# Patient Record
Sex: Female | Born: 1995
Health system: Southern US, Community
[De-identification: ages and names within clinical notes are randomized; demographics above are authoritative.]

## PROBLEM LIST (undated history)

## (undated) DIAGNOSIS — F329 Major depressive disorder, single episode, unspecified: Secondary | ICD-10-CM

## (undated) DIAGNOSIS — B029 Zoster without complications: Secondary | ICD-10-CM

## (undated) DIAGNOSIS — R42 Dizziness and giddiness: Secondary | ICD-10-CM

## (undated) DIAGNOSIS — F32A Depression, unspecified: Secondary | ICD-10-CM

## (undated) DIAGNOSIS — F840 Autistic disorder: Secondary | ICD-10-CM

## (undated) DIAGNOSIS — R55 Syncope and collapse: Secondary | ICD-10-CM

## (undated) HISTORY — DX: Dizziness and giddiness: R42

## (undated) HISTORY — DX: Zoster without complications: B02.9

## (undated) HISTORY — DX: Autistic disorder: F84.0

## (undated) HISTORY — PX: NO PAST SURGERIES: SHX2092

## (undated) HISTORY — DX: Syncope and collapse: R55

---

## 2017-04-25 DIAGNOSIS — T1491XA Suicide attempt, initial encounter: Secondary | ICD-10-CM

## 2017-04-25 HISTORY — DX: Suicide attempt, initial encounter: T14.91XA

## 2018-01-19 ENCOUNTER — Emergency Department
Admission: EM | Admit: 2018-01-19 | Discharge: 2018-01-19 | Disposition: A | Discharge status: Other Acute Inpatient Hospital | Payer: PRIVATE HEALTH INSURANCE | Attending: Emergency Medicine | Admitting: Emergency Medicine

## 2018-01-19 ENCOUNTER — Other Ambulatory Visit: Payer: Self-pay

## 2018-01-19 ENCOUNTER — Inpatient Hospital Stay
Admission: AD | Admit: 2018-01-19 | Discharge: 2018-01-23 | DRG: 885 | Disposition: A | Discharge status: Home / Self Care | Payer: PRIVATE HEALTH INSURANCE | Attending: Psychiatry | Admitting: Psychiatry

## 2018-01-19 ENCOUNTER — Encounter: Payer: Self-pay | Admitting: Psychiatry

## 2018-01-19 DIAGNOSIS — T43692A Poisoning by other psychostimulants, intentional self-harm, initial encounter: Secondary | ICD-10-CM | POA: Insufficient documentation

## 2018-01-19 DIAGNOSIS — T43212A Poisoning by selective serotonin and norepinephrine reuptake inhibitors, intentional self-harm, initial encounter: Secondary | ICD-10-CM | POA: Diagnosis not present

## 2018-01-19 DIAGNOSIS — Z9151 Personal history of suicidal behavior: Secondary | ICD-10-CM

## 2018-01-19 DIAGNOSIS — R45851 Suicidal ideations: Secondary | ICD-10-CM | POA: Diagnosis present

## 2018-01-19 DIAGNOSIS — F909 Attention-deficit hyperactivity disorder, unspecified type: Secondary | ICD-10-CM | POA: Diagnosis present

## 2018-01-19 DIAGNOSIS — T1491XA Suicide attempt, initial encounter: Secondary | ICD-10-CM

## 2018-01-19 DIAGNOSIS — F329 Major depressive disorder, single episode, unspecified: Secondary | ICD-10-CM | POA: Diagnosis present

## 2018-01-19 DIAGNOSIS — F332 Major depressive disorder, recurrent severe without psychotic features: Principal | ICD-10-CM | POA: Diagnosis present

## 2018-01-19 DIAGNOSIS — Z008 Encounter for other general examination: Secondary | ICD-10-CM | POA: Diagnosis not present

## 2018-01-19 DIAGNOSIS — T50902A Poisoning by unspecified drugs, medicaments and biological substances, intentional self-harm, initial encounter: Secondary | ICD-10-CM

## 2018-01-19 HISTORY — DX: Depression, unspecified: F32.A

## 2018-01-19 HISTORY — DX: Personal history of suicidal behavior: Z91.51

## 2018-01-19 HISTORY — DX: Major depressive disorder, single episode, unspecified: F32.9

## 2018-01-19 LAB — COMPREHENSIVE METABOLIC PANEL
ALK PHOS: 54 U/L (ref 38–126)
ALT: 7 U/L (ref 0–44)
AST: 18 U/L (ref 15–41)
Albumin: 5 g/dL (ref 3.5–5.0)
Anion gap: 11 (ref 5–15)
BUN: 12 mg/dL (ref 6–20)
CALCIUM: 9.6 mg/dL (ref 8.9–10.3)
CO2: 21 mmol/L — ABNORMAL LOW (ref 22–32)
CREATININE: 0.82 mg/dL (ref 0.44–1.00)
Chloride: 105 mmol/L (ref 98–111)
Glucose, Bld: 98 mg/dL (ref 70–99)
Potassium: 3.6 mmol/L (ref 3.5–5.1)
Sodium: 137 mmol/L (ref 135–145)
Total Bilirubin: 1.1 mg/dL (ref 0.3–1.2)
Total Protein: 8 g/dL (ref 6.5–8.1)

## 2018-01-19 LAB — CBC WITH DIFFERENTIAL/PLATELET
BASOS ABS: 0 10*3/uL (ref 0–0.1)
Basophils Relative: 0 %
EOS ABS: 0 10*3/uL (ref 0–0.7)
EOS PCT: 0 %
HCT: 39.7 % (ref 35.0–47.0)
Hemoglobin: 14 g/dL (ref 12.0–16.0)
Lymphocytes Relative: 11 %
Lymphs Abs: 1.1 10*3/uL (ref 1.0–3.6)
MCH: 28.9 pg (ref 26.0–34.0)
MCHC: 35.3 g/dL (ref 32.0–36.0)
MCV: 81.7 fL (ref 80.0–100.0)
Monocytes Absolute: 0.7 10*3/uL (ref 0.2–0.9)
Monocytes Relative: 7 %
Neutro Abs: 8.2 10*3/uL — ABNORMAL HIGH (ref 1.4–6.5)
Neutrophils Relative %: 82 %
PLATELETS: 264 10*3/uL (ref 150–440)
RBC: 4.86 MIL/uL (ref 3.80–5.20)
RDW: 13.4 % (ref 11.5–14.5)
WBC: 10.1 10*3/uL (ref 3.6–11.0)

## 2018-01-19 LAB — ACETAMINOPHEN LEVEL: Acetaminophen (Tylenol), Serum: <10 ug/mL — ABNORMAL LOW (ref 10–30)

## 2018-01-19 LAB — SALICYLATE LEVEL: Salicylate Lvl: <7 mg/dL (ref 2.8–30.0)

## 2018-01-19 LAB — HCG, QUANTITATIVE, PREGNANCY: hCG, Beta Chain, Quant, S: <1 m[IU]/mL (ref <5)

## 2018-01-19 LAB — ETHANOL

## 2018-01-19 MED ORDER — PAROXETINE HCL 20 MG PO TABS: 20.0000 mg | ORAL_TABLET | Freq: Every day | ORAL | Status: DC

## 2018-01-19 MED ORDER — MAGNESIUM HYDROXIDE 400 MG/5ML PO SUSP: 30.0000 mL | Freq: Every day | ORAL | Status: DC | PRN

## 2018-01-19 MED ORDER — PAROXETINE HCL 20 MG PO TABS
20.0000 mg | ORAL_TABLET | Freq: Every day | ORAL | Status: DC
  Administered 2018-01-20: 20 mg via ORAL
  Filled 2018-01-19: qty 1

## 2018-01-19 MED ORDER — ACETAMINOPHEN 325 MG PO TABS: 650.0000 mg | ORAL_TABLET | Freq: Four times a day (QID) | ORAL | Status: DC | PRN

## 2018-01-19 MED ORDER — ALUM & MAG HYDROXIDE-SIMETH 200-200-20 MG/5ML PO SUSP: 30.0000 mL | ORAL | Status: DC | PRN

## 2018-01-19 MED ORDER — HYDROXYZINE HCL 50 MG PO TABS
50.0000 mg | ORAL_TABLET | Freq: Three times a day (TID) | ORAL | Status: DC | PRN
  Administered 2018-01-19: 50 mg via ORAL
  Filled 2018-01-19 (×2): qty 1

## 2018-01-19 MED ORDER — SODIUM CHLORIDE 0.9 % IV SOLN
1000.0000 mL | Freq: Once | INTRAVENOUS | Status: AC
  Administered 2018-01-19: 1000 mL via INTRAVENOUS

## 2018-01-19 MED ORDER — TRAZODONE HCL 100 MG PO TABS
100.0000 mg | ORAL_TABLET | Freq: Every evening | ORAL | Status: DC | PRN
  Administered 2018-01-19: 100 mg via ORAL
  Filled 2018-01-19 (×2): qty 1

## 2018-01-19 MED ORDER — LORAZEPAM 2 MG PO TABS
2.0000 mg | ORAL_TABLET | Freq: Once | ORAL | Status: AC
  Administered 2018-01-19: 2 mg via ORAL
  Filled 2018-01-19: qty 1

## 2018-01-19 NOTE — BH Assessment (Signed)
Patient is to be admitted to [facility] by Dr. Toni Amend.  Attending Physician will be Dr. Jennet Maduro.   Patient has been assigned to room 324, by [facility] Charge Nurse Shatara.   ER staff is aware of the admission:  Luann,ER Secretary    Dr. Cyril Loosen, ER MD   Geralynn Ochs, Patient's Nurse   Lacy Duverney. [facility] Patient Access.

## 2018-01-19 NOTE — ED Notes (Signed)
Patient received an Ativan 2mg  due to anxiety, patient says she can not relax she feels very shaky. Informed patient that the medications she may have taken an excess of maybe the reason she is feeling that way

## 2018-01-19 NOTE — ED Provider Notes (Signed)
[facility] Emergency Department Provider Note   ____________________________________________    I have reviewed the triage vital signs and the nursing notes.   HISTORY  Chief Complaint Suicide Attempt     HPI Kathleen Robinson is a 22 y.o. female with a history of major depression who presents today after a suicide attempt.  Patient reports at 11:00 last night she took 20 Adderall tablets, and 25 Paxil.  She states that "I was not supposed to wake up ".  She reports a history of depression in the past but has been overall well controlled.  Currently states she feels fatigued but denies abdominal pain or chest pain.  No shortness of breath.   Past Medical History:  Diagnosis Date  . Depression     Patient Active Problem List   Diagnosis Date Noted  . Severe recurrent major depression without psychotic features (HCC) 01/19/2018  . Suicide attempt (HCC) 01/19/2018  . Amphetamine overdose (HCC) 01/19/2018    History reviewed. No pertinent surgical history.  Prior to Admission medications   Medication Sig Start Date End Date Taking? Authorizing Provider  ADDERALL XR 20 MG 24 hr capsule Take 20 mg by mouth daily. 01/08/18  Yes [provider]  PARoxetine (PAXIL) 20 MG tablet Take 20 mg by mouth every morning. 01/11/18  Yes [provider]     Allergies Wasp venom  Family History  Problem Relation Age of Onset  . Diabetes Other   . Stroke Other     Social History Social History   Tobacco Use  . Smoking status: Never Smoker  . Smokeless tobacco: Never Used  Substance Use Topics  . Alcohol use: Yes    Comment: occassionally   . Drug use: Not on file    Review of Systems  Constitutional: Fatigue Eyes: No visual changes.  ENT: No sore throat. Cardiovascular: Denies chest pain. Respiratory: Denies shortness of breath. Gastrointestinal: No abdominal pain.  No nausea, no vomiting.   Genitourinary: Negative for  dysuria. Musculoskeletal: Negative for back pain. Skin: Negative for rash. Neurological: Negative for headache   ____________________________________________   PHYSICAL EXAM:  VITAL SIGNS: ED Triage Vitals  Enc Vitals Group     BP 01/19/18 0843 (!) 141/120     Pulse Rate 01/19/18 0843 (!) 110     Resp 01/19/18 0843 18     Temp 01/19/18 0843 98.1 F (36.7 C)     Temp Source 01/19/18 0843 Oral     SpO2 01/19/18 0843 99 %     Weight 01/19/18 0844 63.5 kg (140 lb)     Height 01/19/18 0844 1.651 m (5\' 5" )     Head Circumference --      Peak Flow --      Pain Score 01/19/18 0844 0     Pain Loc --      Pain Edu? --      Excl. in GC? --     Constitutional: Alert and oriented.   Nose: No congestion/rhinnorhea. Mouth/Throat: Mucous membranes are moist.    Cardiovascular: Normal rate, regular rhythm. Grossly normal heart sounds.  Good peripheral circulation. Respiratory: Normal respiratory effort.  No retractions. Lungs CTAB. Gastrointestinal: Soft and nontender. No distention.  No CVA tenderness.  Musculoskeletal:  Warm and well perfused Neurologic:  Normal speech and language. No gross focal neurologic deficits are appreciated.  Skin:  Skin is warm, dry and intact. No rash noted. Psychiatric: Very high functioning and clearly intelligent, depressed mood speech and behavior  are normal.  ____________________________________________   LABS (all labs ordered are listed, but only abnormal results are displayed)  Labs Reviewed  COMPREHENSIVE METABOLIC PANEL - Abnormal; Notable for the following components:      Result Value   CO2 21 (*)    All other components within normal limits  CBC WITH DIFFERENTIAL/PLATELET - Abnormal; Notable for the following components:   Neutro Abs 8.2 (*)    All other components within normal limits  ACETAMINOPHEN LEVEL - Abnormal; Notable for the following components:   Acetaminophen (Tylenol), Serum <10 (*)    All other components within  normal limits  ETHANOL  SALICYLATE LEVEL  URINE DRUG SCREEN, QUALITATIVE ([facility] ONLY)  POC URINE PREG, ED   ____________________________________________  EKG  ED ECG REPORT I, Jene Every, the attending physician, personally viewed and interpreted this ECG.  Date: 01/19/2018  Rhythm: Sinus tachycardia QRS Axis: normal Intervals: normal ST/T Wave abnormalities: normal Narrative Interpretation: no evidence of acute ischemia  ____________________________________________  RADIOLOGY  None ____________________________________________   PROCEDURES  Procedure(s) performed: No  Procedures   Critical Care performed: No ____________________________________________   INITIAL IMPRESSION / ASSESSMENT AND PLAN / ED COURSE  Pertinent labs & imaging results that were available during my care of the patient were reviewed by me and considered in my medical decision making (see chart for details).  Patient presents after suicide attempt by intentional drug overdose.  She took her pills at 11 PM last night, vital signs are reassuring.  Lab work is overall unremarkable, Poison control contacted by nurse, recommend supportive care only.  Patient placed under involuntary commitment, TTS and psychiatry consulted.  Psychiatry will admit    ____________________________________________   FINAL CLINICAL IMPRESSION(S) / ED DIAGNOSES  Final diagnoses:  Intentional drug overdose, initial encounter [facility])        Note:  This document was prepared using Dragon voice recognition software and may include unintentional dictation errors.    Jene Every, MD 01/19/18 1451

## 2018-01-19 NOTE — ED Notes (Signed)
Patient talking with father Allia Wiltsey [phone]

## 2018-01-19 NOTE — Progress Notes (Signed)
Patient has been out in the milieu, pleasant and cooperative. Has been talking with peers and maintains appropriate behavior. Alert and oriented. Denying thoughts of self harm but continues to express hopelessness. Staff continue to provide emotional support and encouragements. Safety precautions reinforced.

## 2018-01-19 NOTE — Progress Notes (Signed)
Patient admitted onto BMU unit due to SI attempt last night by overdose on medications. During assessment patient stated her depression and recent loss of relationship with boyfriend of 5 years triggered her to not want to live any longer. Patient states In her mind she just hears how she is a failure at life and no one wants a sensitive woman. 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 Patient is very soft spoken, tearful, logical and coherent. Patient denies at this moment SI, HI and AVH. Patient states she is thankful her friend telephoned for help and she is still alive. Pain 0/10; No skin issues. Patient received alcohol abuse education due to stating she goes on binge drinking of wine monthly but would like to stop. Patient denies cigarette use. While in ED blood pressures where elevated. No self injurious behaviors at this time.

## 2018-01-19 NOTE — ED Notes (Signed)
Spoke with Poison Control nurse Rose, RN [phone] at 1025 and her recommendations are IV fluids for the tachycardia and hypertension, Benzodiazepines if any seizure activity, Supportive care and lastly she recommends observation time for 8 hours to be cleared at 1630

## 2018-01-19 NOTE — ED Notes (Signed)
Patient visiting with father  

## 2018-01-19 NOTE — ED Triage Notes (Signed)
Patient to ED via [facility] c/o suicide attempt. Per EMS patient reported taking approximately "20 adderrall and 25 paxil" tablets last night around 11pm, and reports throwing up twice. Patient is alert and oriented at this time denies pain, but continues to endorse suicidal ideation.

## 2018-01-19 NOTE — Plan of Care (Signed)
Newly admitted but adjusting well to the unit. Sad and anxious but maintains a pleasant attitude. Denying suicidal ideations.

## 2018-01-19 NOTE — BH Assessment (Signed)
Assessment Note  Kathleen Robinson is an 22 y.o. female who presents to the ER due to a suicide attempt. On last night (01/18/2018), the patient took an overdose of her medications 20 Adderall and approximately 15 Paxil. Patient reports, "last night I was supposed to (have) died. I suppose to be dead. I don't suppose to be here (living)..." Patient made this statement several times throughout the interview. She reports of having dealt with thoughts of suicide for approximately a year but had no attempts or gestures until last night. Last night, her girlfriend ended their relationship and patient believes "it was the straw that broke the camel back."  During the interview, the patient was calm, cooperative and pleasant. She was able to provide appropriate answers to the questions. She states she has dealt with depression for majority of her life and her PCP currently prescribes her medications. She seen a psychiatrist when she was in high school but has never been inpatient for treatment.  Patient identified the breakup with her girlfriend as the main cause for her mental and emotional state. She identify other stressors that contributed to her suicide attempt; such as, been unemployed, relationship problems with family and ongoing depression.  Patient currently denies HI and AV/H.  Diagnosis: Depression  Past Medical History:  Past Medical History:  Diagnosis Date  . Depression     History reviewed. No pertinent surgical history.  Family History:  Family History  Problem Relation Age of Onset  . Diabetes Other   . Stroke Other     Social History:  reports that she has never smoked. She has never used smokeless tobacco. She reports that she drinks alcohol. Her drug history is not on file.  Additional Social History:  Alcohol / Drug Use Pain Medications: See PTA Prescriptions: See PTA Over the Counter: See PTA History of alcohol / drug use?: No history of alcohol / drug abuse Longest  period of sobriety (when/how long): Reports of no past or current use Negative Consequences of Use: (n/a) Withdrawal Symptoms: (n/a)  CIWA: CIWA-Ar BP: (!) 152/112 Pulse Rate: (!) 104 COWS:    Allergies:  Allergies  Allergen Reactions  . Wasp Venom Swelling    Home Medications:  (Not in a hospital admission)  OB/GYN Status:  Patient's last menstrual period was 12/29/2017 (lmp unknown).  General Assessment Data Location of Assessment: [facility] ED TTS Assessment: In system Is this a Tele or Face-to-Face Assessment?: Face-to-Face Is this an Initial Assessment or a Re-assessment for this encounter?: Initial Assessment Patient Accompanied by:: N/A Language Other than English: No Living Arrangements: Other (Comment)(Private Home) What gender do you identify as?: Female Marital status: Single Maiden name: Fidel Pregnancy Status: No Living Arrangements: Alone Can pt return to current living arrangement?: Yes Admission Status: Involuntary Petitioner: ED Attending Is patient capable of signing voluntary admission?: No(Under IVC) Referral Source: Self/Family/Friend Insurance type: Astronomer Exam [facility] Walk-in ONLY) Medical Exam completed: Yes  Crisis Care Plan Living Arrangements: Alone Legal Guardian: Other:(Self) Name of Psychiatrist: Reports of none Name of Therapist: Reports of none  Education Status Is patient currently in school?: No Is the patient employed, unemployed or receiving disability?: Unemployed  Risk to self with the past 6 months Suicidal Ideation: Yes-Currently Present Has patient been a risk to self within the past 6 months prior to admission? : Yes Suicidal Intent: Yes-Currently Present Has patient had any suicidal intent within the past 6 months prior to admission? : Yes Is patient at risk for suicide?:  Yes Suicidal Plan?: Yes-Currently Present Has patient had any suicidal plan within the past 6 months prior to admission? :  Yes Specify Current Suicidal Plan: Overdose on medications Access to Means: Yes What has been your use of drugs/alcohol within the last 12 months?: Personal medications Previous Attempts/Gestures: No How many times?: 1(With this visit) Other Self Harm Risks: Reports of none Triggers for Past Attempts: None known Intentional Self Injurious Behavior: None Family Suicide History: Yes(Grandmother) Recent stressful life event(s): Financial Problems, Other (Comment), Loss (Comment) Persecutory voices/beliefs?: No Depression: Yes Depression Symptoms: Tearfulness, Isolating, Guilt, Feeling worthless/self pity, Loss of interest in usual pleasures Substance abuse history and/or treatment for substance abuse?: No Suicide prevention information given to non-admitted patients: Not applicable  Risk to Others within the past 6 months Homicidal Ideation: No Does patient have any lifetime risk of violence toward others beyond the six months prior to admission? : No Thoughts of Harm to Others: No Current Homicidal Intent: No Current Homicidal Plan: No Access to Homicidal Means: No Identified Victim: Reports of none History of harm to others?: No Assessment of Violence: None Noted Violent Behavior Description: Reports of none Does patient have access to weapons?: No Criminal Charges Pending?: No Does patient have a court date: No Is patient on probation?: No  Psychosis Hallucinations: None noted Delusions: None noted  Mental Status Report Appearance/Hygiene: Unremarkable, In scrubs Eye Contact: Good Motor Activity: Freedom of movement, Unremarkable Speech: Logical/coherent, Unremarkable Level of Consciousness: Alert Mood: Depressed, Anxious, Sad, Pleasant Affect: Appropriate to circumstance, Depressed Anxiety Level: Minimal Thought Processes: Coherent, Relevant Judgement: Unimpaired Orientation: Person, Place, Time, Situation, Appropriate for developmental age Obsessive Compulsive  Thoughts/Behaviors: Minimal  Cognitive Functioning Concentration: Normal Memory: Recent Intact, Remote Intact Is patient IDD: No Insight: Fair Impulse Control: Poor Appetite: Good Have you had any weight changes? : No Change Sleep: No Change Total Hours of Sleep: 8 Vegetative Symptoms: None  ADLScreening [facility] Assessment Services) Patient's cognitive ability adequate to safely complete daily activities?: Yes Patient able to express need for assistance with ADLs?: Yes Independently performs ADLs?: Yes (appropriate for developmental age)  Prior Inpatient Therapy Prior Inpatient Therapy: No  Prior Outpatient Therapy Prior Outpatient Therapy: Yes Prior Therapy Dates: "When I was in the High School" Prior Therapy Facilty/Provider(s): Don't remember Reason for Treatment: Depression & ADHD Does patient have an ACCT team?: No Does patient have Intensive In-House Services?  : No Does patient have [facility] services? : No Does patient have P4CC services?: No  ADL Screening (condition at time of admission) Patient's cognitive ability adequate to safely complete daily activities?: Yes Is the patient deaf or have difficulty hearing?: No Does the patient have difficulty seeing, even when wearing glasses/contacts?: No Does the patient have difficulty concentrating, remembering, or making decisions?: No Patient able to express need for assistance with ADLs?: Yes Does the patient have difficulty dressing or bathing?: No Independently performs ADLs?: Yes (appropriate for developmental age) Does the patient have difficulty walking or climbing stairs?: No Weakness of Legs: None Weakness of Arms/Hands: None  Home Assistive Devices/Equipment Home Assistive Devices/Equipment: None  Therapy Consults (therapy consults require a physician order) PT Evaluation Needed: No OT Evalulation Needed: No SLP Evaluation Needed: No Abuse/Neglect Assessment (Assessment to be complete while patient is  alone) Abuse/Neglect Assessment Can Be Completed: Yes Physical Abuse: Denies Verbal Abuse: Denies Sexual Abuse: Denies Exploitation of patient/patient's resources: Denies Self-Neglect: Denies Values / Beliefs Cultural Requests During Hospitalization: None Spiritual Requests During Hospitalization: None Consults Spiritual Care Consult Needed: No  Social Work Consult Needed: No Merchant navy officer (For Healthcare) Does Patient Have a Programmer, multimedia?: No Would patient like information on creating a medical advance directive?: No - Patient declined       Child/Adolescent Assessment Running Away Risk: Denies(Patient is an adult)  Disposition:  Disposition Initial Assessment Completed for this Encounter: Yes  On Site Evaluation by:   Reviewed with Physician:    Lilyan Gilford MS, LCAS, LPC, NCC, CCSI Therapeutic Triage Specialist 01/19/2018 11:43 AM

## 2018-01-19 NOTE — Tx Team (Signed)
Initial Treatment Plan 01/19/2018 6:44 PM Kathleen Robinson ZOX:[id number]    PATIENT STRESSORS: Financial difficulties Loss of break of with signifigant other Occupational concerns   PATIENT STRENGTHS: Ability for insight Capable of independent living Communication skills Motivation for treatment/growth   PATIENT IDENTIFIED PROBLEMS: Coping skills  Low self esteem  Possible Occupational loss                  DISCHARGE CRITERIA:  Ability to meet basic life and health needs Improved stabilization in mood, thinking, and/or behavior Motivation to continue treatment in a less acute level of care  PRELIMINARY DISCHARGE PLAN: Return to previous living arrangement Return to previous work or school arrangements  PATIENT/FAMILY INVOLVEMENT: This treatment plan has been presented to and reviewed with the patient, Kathleen Robinson, and/or family member.  The patient and family have been given the opportunity to ask questions and make suggestions.  Leamon Arnt, RN 01/19/2018, 6:44 PM

## 2018-01-19 NOTE — Consult Note (Signed)
[place] Consult   Reason for Consult: Consult for this 22 year old woman who comes into the hospital after taking a overdose with suicidal intent Referring Physician: Corky Downs Patient Identification: Kathleen Robinson MRN:  [id number] Principal Diagnosis: Severe recurrent major depression without psychotic features [facility]) Diagnosis:   Patient Active Problem List   Diagnosis Date Noted  . Severe recurrent major depression without psychotic features ([place]) [F33.2] 01/19/2018  . Suicide attempt ([place]) [T14.91XA] 01/19/2018  . Amphetamine overdose [facility]) [T43.621A] 01/19/2018    Total Time spent with patient: 1 hour  Subjective:   [name] is a 22 y.o. female patient admitted with "I have been having a really rough time".  HPI: Patient interviewed chart reviewed.  Case reviewed with emergency room physician.  22 year old woman came into the hospital by EMS.  Last night she took an overdose of Adderall and Paxil.  She estimates she took approximately 20 or more of her Adderall which she says were 20 mg strength.  She did this after weeks of depressed mood that have been getting worse.  On top of that having a breakup with her girlfriend.  Recently been sleeping poorly not eating very well.  She took the overdose of pills and did not tell anyone immediately.  Only after a while did she make some suggestive comments online to a friend and it was that person who got her attention to call 911.  Patient continues to state suicidal ideation.  Denies any psychotic symptoms.  Denies that she was drinking or abusing drugs.  She is not currently seeing a mental health provider but takes Adderall and Paxil prescribed by her primary care doctor.  Social history: Patient is living by herself.  Works [employer] on [employer].  Recently broke up with a girlfriend.  Medical history: Remains a little bit hypertensive and tachycardic but medically stable otherwise and does not have any ongoing  medical issues.  Substance abuse history: Says that she drinks only occasionally denies any drinking problems denies that she uses any other drugs.  Past Psychiatric History: Patient reports having problems with depression and mood instability going back into adolescence at least.  Has had serious suicidal thoughts in the past and has been thinking about it more consistently for months now.  No previous psychiatric hospitalization or admission.  No previous suicide attempts.  No history of psychosis or mania.  Risk to Self: Suicidal Ideation: Yes-Currently Present Suicidal Intent: Yes-Currently Present Is patient at risk for suicide?: Yes Suicidal Plan?: Yes-Currently Present Specify Current Suicidal Plan: Overdose on medications Access to Means: Yes What has been your use of drugs/alcohol within the last 12 months?: Personal medications How many times?: 1(With this visit) Other Self Harm Risks: Reports of none Triggers for Past Attempts: None known Intentional Self Injurious Behavior: None Risk to Others: Homicidal Ideation: No Thoughts of Harm to Others: No Current Homicidal Intent: No Current Homicidal Plan: No Access to Homicidal Means: No Identified Victim: Reports of none History of harm to others?: No Assessment of Violence: None Noted Violent Behavior Description: Reports of none Does patient have access to weapons?: No Criminal Charges Pending?: No Does patient have a court date: No Prior Inpatient Therapy: Prior Inpatient Therapy: No Prior Outpatient Therapy: Prior Outpatient Therapy: Yes Prior Therapy Dates: "When I was in the [employer]" Prior Therapy Facilty/Provider(s): Don't remember Reason for Treatment: Depression & ADHD Does patient have an ACCT team?: No Does patient have Intensive In-House Services?  : No Does patient have [facility] services? :  No Does patient have P4CC services?: No  Past Medical History:  Past Medical History:  Diagnosis Date  .  Depression    History reviewed. No pertinent surgical history. Family History:  Family History  Problem Relation Age of Onset  . Diabetes Other   . Stroke Other    Family Psychiatric  History: She says she had a grandfather who attempted suicide more than once Social History:  Social History   Substance and Sexual Activity  Alcohol Use Yes   Comment: occassionally      Social History   Substance and Sexual Activity  Drug Use Not on file    Social History   Socioeconomic History  . Marital status: Single    Spouse name: Not on file  . Number of children: Not on file  . Years of education: Not on file  . Highest education level: Not on file  Occupational History  . Not on file  Social Needs  . Financial resource strain: Not on file  . Food insecurity:    Worry: Not on file    Inability: Not on file  . Transportation needs:    Medical: Not on file    Non-medical: Not on file  Tobacco Use  . Smoking status: Never Smoker  . Smokeless tobacco: Never Used  Substance and Sexual Activity  . Alcohol use: Yes    Comment: occassionally   . Drug use: Not on file  . Sexual activity: Not on file  Lifestyle  . Physical activity:    Days per week: Not on file    Minutes per session: Not on file  . Stress: Not on file  Relationships  . Social connections:    Talks on phone: Not on file    Gets together: Not on file    Attends religious service: Not on file    Active member of club or organization: Not on file    Attends meetings of clubs or organizations: Not on file    Relationship status: Not on file  Other Topics Concern  . Not on file  Social History Narrative  . Not on file   Additional Social History:    Allergies:   Allergies  Allergen Reactions  . Wasp Venom Swelling    Labs:  Results for orders placed or performed during the hospital encounter of 01/19/18 (from the past 48 hour(s))  Comprehensive metabolic panel     Status: Abnormal   Collection  Time: 01/19/18  9:11 AM  Result Value Ref Range   Sodium 137 135 - 145 mmol/L   Potassium 3.6 3.5 - 5.1 mmol/L   Chloride 105 98 - 111 mmol/L   CO2 21 (L) 22 - 32 mmol/L   Glucose, Bld 98 70 - 99 mg/dL   BUN 12 6 - 20 mg/dL   Creatinine, Ser 0.82 0.44 - 1.00 mg/dL   Calcium 9.6 8.9 - 10.3 mg/dL   Total Protein 8.0 6.5 - 8.1 g/dL   Albumin 5.0 3.5 - 5.0 g/dL   AST 18 15 - 41 U/L   ALT 7 0 - 44 U/L   Alkaline Phosphatase 54 38 - 126 U/L   Total Bilirubin 1.1 0.3 - 1.2 mg/dL   GFR calc non Af Amer >60 >60 mL/min   GFR calc Af Amer >60 >60 mL/min    Comment: (NOTE) The eGFR has been calculated using the CKD EPI equation. This calculation has not been validated in all clinical situations. eGFR's persistently <60 mL/min signify possible  Chronic Kidney Disease.    Anion gap 11 5 - 15    Comment: Performed at [facility], [address] [place], [place] [address]  Ethanol     Status: None   Collection Time: 01/19/18  9:11 AM  Result Value Ref Range   Alcohol, Ethyl (B) <10 <10 mg/dL    Comment: (NOTE) Lowest detectable limit for serum alcohol is 10 mg/dL. For medical purposes only. Performed at [facility], [address] [place], [place] [address]   CBC with Diff     Status: Abnormal   Collection Time: 01/19/18  9:11 AM  Result Value Ref Range   WBC 10.1 3.6 - 11.0 K/uL   RBC 4.86 3.80 - 5.20 MIL/uL   Hemoglobin 14.0 12.0 - 16.0 g/dL   HCT 39.7 35.0 - 47.0 %   MCV 81.7 80.0 - 100.0 fL   MCH 28.9 26.0 - 34.0 pg   MCHC 35.3 32.0 - 36.0 g/dL   RDW 13.4 11.5 - 14.5 %   Platelets 264 150 - 440 K/uL   Neutrophils Relative % 82 %   Neutro Abs 8.2 (H) 1.4 - 6.5 K/uL   Lymphocytes Relative 11 %   Lymphs Abs 1.1 1.0 - 3.6 K/uL   Monocytes Relative 7 %   Monocytes Absolute 0.7 0.2 - 0.9 K/uL   Eosinophils Relative 0 %   Eosinophils Absolute 0.0 0 - 0.7 K/uL   Basophils Relative 0 %   Basophils Absolute 0.0 0 - 0.1 K/uL    Comment: Performed at [facility], [address] [place], [place] [address]  Acetaminophen level     Status: Abnormal   Collection Time: 01/19/18  9:11 AM  Result Value Ref Range   Acetaminophen (Tylenol), Serum <10 (L) 10 - 30 ug/mL    Comment: (NOTE) Therapeutic concentrations vary significantly. A range of 10-30 ug/mL  may be an effective concentration for many patients. However, some  are best treated at concentrations outside of this range. Acetaminophen concentrations >150 ug/mL at 4 hours after ingestion  and >50 ug/mL at 12 hours after ingestion are often associated with  toxic reactions. Performed at [facility], [address] [place], [place] [address]   Salicylate level     Status: None   Collection Time: 01/19/18  9:11 AM  Result Value Ref Range   Salicylate Lvl <6.9 2.8 - 30.0 mg/dL    Comment: Performed at [facility], [address] [place], [place] [address]    No current facility-administered medications for this encounter.    Current Outpatient Medications  Medication Sig Dispense Refill  . ADDERALL XR 20 MG 24 hr capsule Take 20 mg by mouth daily.  0  . PARoxetine (PAXIL) 20 MG tablet Take 20 mg by mouth every morning.  3    Musculoskeletal: Strength & Muscle Tone: within normal limits Gait & Station: normal Patient leans: N/A  Psychiatric Specialty Exam: Physical Exam  Nursing note and vitals reviewed. Constitutional: She appears well-developed and well-nourished.  HENT:  Head: Normocephalic and atraumatic.  Eyes: Pupils are equal, round, and reactive to light. Conjunctivae are normal.  Neck: Normal range of motion.  Cardiovascular: Normal heart sounds.  Still a little bit tachycardic and hypertensive but stable and normal sinus rhythm  Respiratory: Effort normal.  GI: Soft.  Musculoskeletal: Normal range of motion.  Neurological: She is alert.  Skin: Skin is warm and dry.  Psychiatric: Her speech is delayed and tangential. She is  slowed  and withdrawn. Thought content is not paranoid. Cognition and memory are impaired. She expresses impulsivity. She exhibits a depressed mood. She expresses suicidal ideation. She expresses no homicidal ideation. She expresses suicidal plans.    Review of Systems  Constitutional: Negative.   HENT: Negative.   Eyes: Negative.   Respiratory: Negative.   Cardiovascular: Negative.   Gastrointestinal: Negative.   Musculoskeletal: Negative.   Skin: Negative.   Neurological: Negative.   Psychiatric/Behavioral: Positive for depression and suicidal ideas. Negative for hallucinations, memory loss and substance abuse. The patient is nervous/anxious and has insomnia.     Blood pressure (!) 152/112, pulse (!) 104, temperature 98.1 F (36.7 C), temperature source Oral, resp. rate 18, height '5\' 5"'  (1.651 m), weight 63.5 kg, last menstrual period 12/29/2017, SpO2 99 %.Body mass index is 23.3 kg/m.  General Appearance: Fairly Groomed  Eye Contact:  Fair  Speech:  Slow  Volume:  Normal  Mood:  Depressed  Affect:  Depressed  Thought Process:  Disorganized  Orientation:  Full (Time, Place, and Person)  Thought Content:  Rumination and Tangential  Suicidal Thoughts:  Yes.  with intent/plan  Homicidal Thoughts:  No  Memory:  Immediate;   Fair Recent;   Fair Remote;   Fair  Judgement:  Impaired  Insight:  Shallow  Psychomotor Activity:  Normal  Concentration:  Concentration: Fair  Recall:  [employer] of Knowledge:  Fair  Language:  Fair  Akathisia:  No  Handed:  Right  AIMS (if indicated):     Assets:  Desire for Improvement Housing Physical Health Resilience  ADL's:  Intact  Cognition:  WNL  Sleep:        Treatment Plan Summary: Daily contact with patient to assess and evaluate symptoms and progress in treatment, Medication management and Plan 22 year old woman who made a serious suicide attempt.  Patient presents currently as being calm.  There is something in her demeanor that at times  seems overly detailed which may possibly be from the excessive amount of Adderall but she is not showing any signs of psychosis or mania.  Physically stable.  Case reviewed with emergency room doctor who feels comfortable that the patient does not need further cardiac monitoring and may be admitted to the psychiatric hospital.  Patient understands the plan.  Case reviewed with TTS.  Patient will be admitted to the psychiatric ward.  Hold off on restarting any Adderall at this point.  Try to restart the Paxil just to prevent any withdrawal problems as she comes off the high dosage that she took.  PRN medicine for anxiety.  15-minute checks in place continue IV C.  Disposition: Recommend psychiatric Inpatient admission when medically cleared. Supportive therapy provided about ongoing stressors.  Alethia Berthold, MD 01/19/2018 12:09 PM

## 2018-01-20 DIAGNOSIS — F332 Major depressive disorder, recurrent severe without psychotic features: Principal | ICD-10-CM

## 2018-01-20 LAB — LIPID PANEL
CHOL/HDL RATIO: 3.3 ratio
Cholesterol: 182 mg/dL (ref 0–200)
HDL: 55 mg/dL (ref >40)
LDL CALC: 108 mg/dL — AB (ref 0–99)
Triglycerides: 93 mg/dL (ref <150)
VLDL: 19 mg/dL (ref 0–40)

## 2018-01-20 LAB — HEMOGLOBIN A1C
Hgb A1c MFr Bld: 4.9 % (ref 4.8–5.6)
MEAN PLASMA GLUCOSE: 93.93 mg/dL

## 2018-01-20 LAB — TSH: TSH: 10.221 u[IU]/mL — AB (ref 0.350–4.500)

## 2018-01-20 MED ORDER — PAROXETINE HCL 30 MG PO TABS
30.0000 mg | ORAL_TABLET | Freq: Every day | ORAL | Status: DC
  Administered 2018-01-21 – 2018-01-23 (×3): 30 mg via ORAL
  Filled 2018-01-20 (×3): qty 1

## 2018-01-20 NOTE — H&P (Signed)
Psychiatric Admission Assessment Adult  Patient Identification: Kathleen Robinson MRN:  [id number] Date of Evaluation:  01/20/2018 Chief Complaint:  DEPRESSION Principal Diagnosis: Severe recurrent major depression without psychotic features [facility]) Diagnosis:   Patient Active Problem List   Diagnosis Date Noted  . Severe recurrent major depression without psychotic features (HCC) [F33.2] 01/19/2018  . Suicide attempt (HCC) [T14.91XA] 01/19/2018  . Amphetamine overdose [facility]) [T43.621A] 01/19/2018   History of Present Illness:  I took pills" Pt is 22 year old woman with h/o depression and mood instability admitted from ED, under IVC. Per report, pt came into the hospital by EMS, she took an overdose of Adderall and Paxil on Fri,  approximately 20 or more of her Adderall 20 mg.  She took the overdose of pills and did not tell anyone immediately.  Only after a while did she make some suggestive comments online to a friend and it was that person who got her attention to call 911. Reports  depressed mood for few weeks,  getting worse, recently had a breakup with her girlfriend.  reports poor sleep and appetite.    Patient continues to state suicidal ideation, no plan, contracts for safety.  Denies any psychotic symptoms.  Denies that she was drinking or abusing drugs.  She is not currently seeing a mental health provider but takes Adderall and Paxil prescribed by her primary care doctor.  Total Time spent with patient: 1 hour  Past Psychiatric History:  depression and mood instability for several yrs.   Has had serious suicidal thoughts in the past and has been thinking about it more consistently for months now.  No previous psychiatric hospitalization or admission.  No previous suicide attempts.  No history of psychosis or mania.  Is the patient at risk to self? Yes.    Has the patient been a risk to self in the past 6 months? Yes.    Has the patient been a risk to self within the distant past? Yes.     Is the patient a risk to others? No.  Has the patient been a risk to others in the past 6 months? No.  Has the patient been a risk to others within the distant past? No.   Prior Inpatient Therapy:   Prior Outpatient Therapy:    Alcohol Screening: 1. How often do you have a drink containing alcohol?: 2 to 4 times a month 2. How many drinks containing alcohol do you have on a typical day when you are drinking?: 5 or 6 3. How often do you have six or more drinks on one occasion?: Monthly AUDIT-C Score: 6 4. How often during the last year have you found that you were not able to stop drinking once you had started?: Monthly 5. How often during the last year have you failed to do what was normally expected from you becasue of drinking?: Never 6. How often during the last year have you needed a first drink in the morning to get yourself going after a heavy drinking session?: Monthly 7. How often during the last year have you had a feeling of guilt of remorse after drinking?: Monthly 8. How often during the last year have you been unable to remember what happened the night before because you had been drinking?: Never 9. Have you or someone else been injured as a result of your drinking?: No 10. Has a relative or friend or a doctor or another health worker been concerned about your drinking or suggested you cut down?: Yes, but  not in the last year Alcohol Use Disorder Identification Test Final Score (AUDIT): 14 Intervention/Follow-up: Alcohol Education Substance Abuse History in the last 12 months:  No.   drinks only occasionally denies any drinking problems denies that she uses any other drugs. Consequences of Substance Abuse:  Previous Psychotropic Medications: Yes  Psychological Evaluations:  Past Medical History:  Past Medical History:  Diagnosis Date  . Depression    History reviewed. No pertinent surgical history. Family History:  Family History  Problem Relation Age of Onset  .  Diabetes Other   . Stroke Other    Family Psychiatric  History: grandfather who attempted suicide more than once Tobacco Screening: Have you used any form of tobacco in the last 30 days? (Cigarettes, Smokeless Tobacco, Cigars, and/or Pipes): No Social History:  Social History   Substance and Sexual Activity  Alcohol Use Yes   Comment: occassionally      Social History   Substance and Sexual Activity  Drug Use Not on file    Additional Social History:      Patient is living by herself.  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                      Allergies:   Allergies  Allergen Reactions  . Wasp Venom Swelling   Lab Results:  Results for orders placed or performed during the hospital encounter of 01/19/18 (from the past 48 hour(s))  hCG, quantitative, pregnancy     Status: None   Collection Time: 01/19/18  4:47 PM  Result Value Ref Range   hCG, Beta Chain, Quant, S <1 <5 mIU/mL    Comment:          GEST. AGE      CONC.  (mIU/mL)   <=1 WEEK        5 - 50     2 WEEKS       50 - 500     3 WEEKS       100 - 10,000     4 WEEKS     1,000 - 30,000     5 WEEKS     3,500 - 115,000   6-8 WEEKS     12,000 - 270,000    12 WEEKS     15,000 - 220,000        FEMALE AND NON-PREGNANT FEMALE:     LESS THAN 5 mIU/mL Performed at [facility], [address] [place], [place] [address]   Hemoglobin A1c     Status: None   Collection Time: 01/20/18  6:56 AM  Result Value Ref Range   Hgb A1c MFr Bld 4.9 4.8 - 5.6 %    Comment: (NOTE) Pre diabetes:          5.7%-6.4% Diabetes:              >6.4% Glycemic control for   <7.0% adults with diabetes    Mean Plasma Glucose 93.93 mg/dL    Comment: Performed at [facility], [address], [place], [place] [address]  Lipid panel     Status: Abnormal   Collection Time: 01/20/18  6:56 AM  Result Value Ref Range   Cholesterol 182 0 - 200 mg/dL   Triglycerides 93 <098 mg/dL   HDL 55 >11 mg/dL    Total CHOL/HDL Ratio 3.3 RATIO   VLDL 19 0 - 40 mg/dL   LDL Cholesterol 914 (H) 0 -  99 mg/dL    Comment:        Total Cholesterol/HDL:CHD Risk Coronary Heart Disease Risk Table                     Men   Women  1/2 Average Risk   3.4   3.3  Average Risk       5.0   4.4  2 X Average Risk   9.6   7.1  3 X Average Risk  23.4   11.0        Use the calculated Patient Ratio above and the CHD Risk Table to determine the patient's CHD Risk.        ATP III CLASSIFICATION (LDL):  <100     mg/dL   Optimal  562-130  mg/dL   Near or Above                    Optimal  130-159  mg/dL   Borderline  865-784  mg/dL   High  >696     mg/dL   Very High Performed at [facility], [address] [place], [place] [address]   TSH     Status: Abnormal   Collection Time: 01/20/18  6:56 AM  Result Value Ref Range   TSH 10.221 (H) 0.350 - 4.500 uIU/mL    Comment: Performed by a 3rd Generation assay with a functional sensitivity of <=0.01 uIU/mL. Performed at [facility], [address] [place], [place] [address]     Blood Alcohol level:  Lab Results  Component Value Date   [facility] <10 01/19/2018    Metabolic Disorder Labs:  Lab Results  Component Value Date   HGBA1C 4.9 01/20/2018   MPG 93.93 01/20/2018   No results found for: PROLACTIN Lab Results  Component Value Date   CHOL 182 01/20/2018   TRIG 93 01/20/2018   HDL 55 01/20/2018   CHOLHDL 3.3 01/20/2018   VLDL 19 01/20/2018   LDLCALC 108 (H) 01/20/2018    Current Medications: Current Facility-Administered Medications  Medication Dose Route Frequency Provider Last Rate Last Dose  . acetaminophen (TYLENOL) tablet 650 mg  650 mg Oral Q6H PRN Clapacs, John T, MD      . alum & mag hydroxide-simeth (MAALOX/MYLANTA) 200-200-20 MG/5ML suspension 30 mL  30 mL Oral Q4H PRN Clapacs, John T, MD      . hydrOXYzine (ATARAX/VISTARIL) tablet 50 mg  50 mg Oral TID PRN Clapacs, Jackquline Denmark, MD   50 mg at 01/19/18 2211  . magnesium  hydroxide (MILK OF MAGNESIA) suspension 30 mL  30 mL Oral Daily PRN Clapacs, John T, MD      . PARoxetine (PAXIL) tablet 20 mg  20 mg Oral Daily Clapacs, Jackquline Denmark, MD   20 mg at 01/20/18 0825  . traZODone (DESYREL) tablet 100 mg  100 mg Oral QHS PRN Clapacs, Jackquline Denmark, MD   100 mg at 01/19/18 2247   PTA Medications: Medications Prior to Admission  Medication Sig Dispense Refill Last Dose  . ADDERALL XR 20 MG 24 hr capsule Take 20 mg by mouth daily.  0 01/18/2018 at 2300  . PARoxetine (PAXIL) 20 MG tablet Take 20 mg by mouth every morning.  3 01/18/2018 at 2300    Musculoskeletal: Strength & Muscle Tone: within normal limits Gait & Station: normal Patient leans:   Psychiatric Specialty Exam: Physical Exam  Nursing note and vitals reviewed.   ROS  Blood pressure 93/65, pulse Marland Kitchen)  109, temperature 98.2 F (36.8 C), temperature source Oral, resp. rate 16, height 5\' 5"  (1.651 m), weight 63.5 kg, last menstrual period 12/29/2017, SpO2 100 %.Body mass index is 23.3 kg/m.  General Appearance: Fairly Groomed  Eye Contact:  Fair  Speech:  Slow  Volume:  Normal  Mood:  Depressed  Affect:  Depressed, congruent  Thought Process:  Disorganized  Orientation:  Full (Time, Place, and Person)  Thought Content:  Rumination , depressed  Suicidal Thoughts:  Yes.  no plan  Homicidal Thoughts:  No  Memory:  Immediate;   Fair Recent;   Fair Remote;   Fair  Judgement:  Impaired  Insight:  Shallow  Psychomotor Activity:  Normal  Concentration:  Concentration: Fair  Recall:  [employer] of Knowledge:  Fair  Language:  Fair  Akathisia:  No  Handed:  Right  AIMS (if indicated):     Assets:  Desire for Improvement Housing Physical Health Resilience  ADL's:  Intact  Cognition:  WNL  Sleep:   5 hrs     Treatment Plan Summary: Daily contact with patient to assess and evaluate symptoms and progress in treatment and Medication management. Pt with h/o depression s/p suicide attempt with OD on pills. Pt  depressed and suicidal.  paxil restarted. Increase paxil.  Group/milieu tx.   Observation Level/Precautions:  15 minute checks  Laboratory:  CBC Chemistry Profile HCG UDS UA  Psychotherapy:    Medications:    Consultations:    Discharge Concerns:    Estimated LOS:  Other:     Physician Treatment Plan for Primary Diagnosis: Severe recurrent major depression without psychotic features (HCC) Long Term Goal(s): Improvement in symptoms so as ready for discharge  Short Term Goals: Ability to identify changes in lifestyle to reduce recurrence of condition will improve, Ability to verbalize feelings will improve, Ability to disclose and discuss suicidal ideas, Ability to demonstrate self-control will improve, Ability to identify and develop effective coping behaviors will improve, Ability to maintain clinical measurements within normal limits will improve, Compliance with prescribed medications will improve and Ability to identify triggers associated with substance abuse/mental health issues will improve  Physician Treatment Plan for Secondary Diagnosis: Principal Problem:   Severe recurrent major depression without psychotic features (HCC) Active Problems:   Suicide attempt (HCC)   Amphetamine overdose (HCC)  Long Term Goal(s): Improvement in symptoms so as ready for discharge  Short Term Goals: Ability to identify changes in lifestyle to reduce recurrence of condition will improve, Ability to verbalize feelings will improve, Ability to disclose and discuss suicidal ideas, Ability to demonstrate self-control will improve, Ability to identify and develop effective coping behaviors will improve, Ability to maintain clinical measurements within normal limits will improve, Compliance with prescribed medications will improve and Ability to identify triggers associated with substance abuse/mental health issues will improve  I certify that inpatient services furnished can reasonably be expected to  improve the patient's condition.    Beverly Sessions, MD 9/28/20195:58 PM

## 2018-01-20 NOTE — [facility] Group Notes (Signed)
LCSW Group Therapy Note  01/20/2018 1:15pm  Type of Therapy and Topic:  Group Therapy:  Cognitive Distortions  Participation Level:  Active   Description of Group:    Patients in this group will be introduced to the topic of cognitive distortions.  Patients will identify and describe cognitive distortions, describe the feelings these distortions create for them.  Patients will identify one or more situations in their personal life where they have cognitively distorted thinking and will verbalize challenging this cognitive distortion through positive thinking skills.  Patients will practice the skill of using positive affirmations to challenge cognitive distortions using affirmation cards.    Therapeutic Goals:  1. Patient will identify two or more cognitive distortions they have used 2. Patient will identify one or more emotions that stem from use of a cognitive distortion 3. Patient will demonstrate use of a positive affirmation to counter a cognitive distortion through discussion and/or role play. 4. Patient will describe one way cognitive distortions can be detrimental to wellness   Summary of Patient Progress: The patient scored her mood at a 5 (10 best.) Patients were introduced to the topic of cognitive distortions. The patient was able to identify and describe cognitive distortions, described the feelings these distortions create for her.  The patient shared that her unrealistic thinking style is mental filter. Patient identified a situation in her personal life where she has cognitively distorted thinking and was able to verbalize and challenged this cognitive distortion through positive thinking skills. Patient was able to provide support and validation to other group members.     Therapeutic Modalities:   Cognitive Behavioral Therapy Motivational Interviewing   Kathleen Robinson  CUEBAS-COLON, LCSW 01/20/2018 12:41 PM

## 2018-01-20 NOTE — Plan of Care (Signed)
D: Patient denies SI/HI/AVH. Patient is calm, cooperative and pleasant. Patient is seen in milieu interacting with peers. Patient has no complaints at this time. Patient has no complaints but appears flat and depression. Patient is seen crying after phone call this morning, but later is composed and smiling.  A: Patient was assessed by this nurse. Patient was oriented to unit. Patient's safety was maintained on unit. Q x 15 minute observation checks were completed for safety. Patient care plan was reviewed. Patient was offered support and encouragement. Patient was encourage to attend groups, participate in unit activities and continue with plan of care.    R: Patient has no complaints of pain at this time. Patient is receptive to treatment and safety maintained on unit.     Problem: Safety: Goal: Ability to remain free from injury will improve Outcome: Progressing   Problem: Education: Goal: Emotional status will improve Outcome: Not Progressing Goal: Mental status will improve Outcome: Not Progressing

## 2018-01-20 NOTE — [facility] Suicide Risk Assessment (Signed)
[facility] Admission Suicide Risk Assessment   Nursing information obtained from:    Demographic factors:  Caucasian Current Mental Status:  Suicidal ideation indicated by patient Loss Factors:  Decrease in vocational status, Loss of significant relationship Historical Factors:  Prior suicide attempts Risk Reduction Factors:  Positive social support  Total Time spent with patient: 1 hour Principal Problem: Severe recurrent major depression without psychotic features (HCC) Diagnosis:   Patient Active Problem List   Diagnosis Date Noted  . Severe recurrent major depression without psychotic features (HCC) [F33.2] 01/19/2018  . Suicide attempt (HCC) [T14.91XA] 01/19/2018  . Amphetamine overdose [facility]) [Z61.096E] 01/19/2018   Subjective Data:  I took pills" Pt is 22 year old woman with h/o depression and mood instability admitted from ED, under IVC. Per report, pt came into the hospital by EMS, she took an overdose of Adderall and Paxil on Fri,  approximately 20 or more of her Adderall 20 mg.  She took the overdose of pills and did not tell anyone immediately. Only after a while did she make some suggestive comments online to a friend and it was that person who got her attention to call 911.Reports  depressed mood for few weeks,  getting worse, recently had a breakup with her girlfriend. reports poor sleep and appetite.  Patient continues to state suicidal ideation, no plan, contracts for safety. Denies any psychotic symptoms. Denies that she was drinking or abusing drugs. She is not currently seeing a mental health provider but takes Adderall and Paxil prescribed by her primary care doctor. Continued Clinical Symptoms:  Alcohol Use Disorder Identification Test Final Score (AUDIT): 14 The "Alcohol Use Disorders Identification Test", Guidelines for Use in Primary Care, Second Edition.  World Science writer [facility]). Score between 0-7:  no or low risk or alcohol related problems. Score between  8-15:  moderate risk of alcohol related problems. Score between 16-19:  high risk of alcohol related problems. Score 20 or above:  warrants further diagnostic evaluation for alcohol dependence and treatment.   CLINICAL FACTORS:   Depression:   Severe   Musculoskeletal: Strength & Muscle Tone: within normal limits Gait & Station: normal Patient leans:   Psychiatric Specialty Exam: Physical Exam  Nursing note and vitals reviewed.   ROS  Blood pressure 93/65, pulse (!) 109, temperature 98.2 F (36.8 C), temperature source Oral, resp. rate 16, height 5\' 5"  (1.651 m), weight 63.5 kg, last menstrual period 12/29/2017, SpO2 100 %.Body mass index is 23.3 kg/m.  General Appearance:Fairly Groomed  Eye Contact:Fair  Speech:Slow  Volume:Normal  Mood:Depressed  Affect:Depressed, congruent  Thought Process:Disorganized  Orientation:Full (Time, Place, and Person)  Thought Content:Rumination , depressed  Suicidal Thoughts:Yes.no plan  Homicidal Thoughts:No  Memory:Immediate;Fair Recent;Fair Remote;Fair  Judgement:Impaired  Insight:Shallow  Psychomotor Activity:Normal  Concentration:Concentration:Fair  Recall:Fair  Fund of Knowledge:Fair  Language:Fair  Akathisia:No  Handed:Right  AIMS (if indicated):   Assets:Desire for Improvement Housing Physical Health Resilience  ADL's:Intact  Cognition:WNL  Sleep: 5 hrs       COGNITIVE FEATURES THAT CONTRIBUTE TO RISK:  Closed-mindedness    SUICIDE RISK:   high  PLAN OF CARE: monitor SI Daily contact with patient to assess and evaluate symptoms and progress in treatment and Medication management. Pt with h/o depression s/p suicide attempt with OD on pills. Pt depressed and suicidal.  paxil restarted. Increase paxil.  Group/milieu tx.   Observation Level/Precautions:  15 minute checks  Laboratory:  CBC Chemistry Profile HCG UDS UA  Psychotherapy:     Medications:    Consultations:  Discharge Concerns:    Estimated LOS:  Other:     Physician Treatment Plan for Primary Diagnosis: Severe recurrent major depression without psychotic features (HCC) Long Term Goal(s): Improvement in symptoms so as ready for discharge  Short Term Goals: Ability to identify changes in lifestyle to reduce recurrence of condition will improve, Ability to verbalize feelings will improve, Ability to disclose and discuss suicidal ideas, Ability to demonstrate self-control will improve, Ability to identify and develop effective coping behaviors will improve, Ability to maintain clinical measurements within normal limits will improve, Compliance with prescribed medications will improve and Ability to identify triggers associated with substance abuse/mental health issues will improve  Physician Treatment Plan for Secondary Diagnosis: Principal Problem:   Severe recurrent major depression without psychotic features (HCC) Active Problems:   Suicide attempt (HCC)   Amphetamine overdose (HCC)  Long Term Goal(s): Improvement in symptoms so as ready for discharge  Short Term Goals: Ability to identify changes in lifestyle to reduce recurrence of condition will improve, Ability to verbalize feelings will improve, Ability to disclose and discuss suicidal ideas, Ability to demonstrate self-control will improve, Ability to identify and develop effective coping behaviors will improve, Ability to maintain clinical measurements within normal limits will improve, Compliance with prescribed medications will improve and Ability to identify triggers associated with substance abuse/mental health issues will improve   I certify that inpatient services furnished can reasonably be expected to improve the patient's condition.   Beverly Sessions, MD 01/20/2018, 6:15 PM

## 2018-01-21 NOTE — Progress Notes (Signed)
[facility] MD Progress Note  01/21/2018 1:39 PM [facility]  MRN:  [id number] Subjective:   Pt is 22 year old woman with h/o depression and mood instability admitted from ED, under IVC. Per report, pt came into the hospital by EMS, she took an overdose of Adderall and Paxil . "I feel better today, my family came to see me yesterday" Pt still endorses depression, denies SI, pt guarded and childlike at times,  Principal Problem: Severe recurrent major depression without psychotic features (HCC) Diagnosis:   Patient Active Problem List   Diagnosis Date Noted  . Severe recurrent major depression without psychotic features (HCC) [F33.2] 01/19/2018  . Suicide attempt (HCC) [T14.91XA] 01/19/2018  . Amphetamine overdose [facility]) [T43.621A] 01/19/2018   Total Time spent with patient: 25 min  Past Psychiatric History: see H & P note  Past Medical History:  Past Medical History:  Diagnosis Date  . Depression    History reviewed. No pertinent surgical history. Family History:  Family History  Problem Relation Age of Onset  . Diabetes Other   . Stroke Other    Family Psychiatric  History: see H & p note Social History:  Social History   Substance and Sexual Activity  Alcohol Use Yes   Comment: occassionally      Social History   Substance and Sexual Activity  Drug Use Not on file    Social History   Socioeconomic History  . Marital status: Single    Spouse name: Not on file  . Number of children: Not on file  . Years of education: Not on file  . Highest education level: Not on file  Occupational History  . Not on file  Social Needs  . Financial resource strain: Not on file  . Food insecurity:    Worry: Not on file    Inability: Not on file  . Transportation needs:    Medical: Not on file    Non-medical: Not on file  Tobacco Use  . Smoking status: Never Smoker  . Smokeless tobacco: Never Used  Substance and Sexual Activity  . Alcohol use: Yes    Comment: occassionally    . Drug use: Not on file  . Sexual activity: Not on file  Lifestyle  . Physical activity:    Days per week: Not on file    Minutes per session: Not on file  . Stress: Not on file  Relationships  . Social connections:    Talks on phone: Not on file    Gets together: Not on file    Attends religious service: Not on file    Active member of club or organization: Not on file    Attends meetings of clubs or organizations: Not on file    Relationship status: Not on file  Other Topics Concern  . Not on file  Social History Narrative  . Not on file   Additional Social History:                         Sleep: Good  Appetite:  Fair  Current Medications: Current Facility-Administered Medications  Medication Dose Route Frequency Provider Last Rate Last Dose  . acetaminophen (TYLENOL) tablet 650 mg  650 mg Oral Q6H PRN Clapacs, John T, MD      . alum & mag hydroxide-simeth (MAALOX/MYLANTA) 200-200-20 MG/5ML suspension 30 mL  30 mL Oral Q4H PRN Clapacs, John T, MD      . hydrOXYzine (ATARAX/VISTARIL) tablet 50 mg  50 mg  Oral TID PRN Clapacs, Jackquline Denmark, MD   50 mg at 01/19/18 2211  . magnesium hydroxide (MILK OF MAGNESIA) suspension 30 mL  30 mL Oral Daily PRN Clapacs, John T, MD      . PARoxetine (PAXIL) tablet 30 mg  30 mg Oral Daily Beverly Sessions, MD   30 mg at 01/21/18 0749  . traZODone (DESYREL) tablet 100 mg  100 mg Oral QHS PRN Clapacs, Jackquline Denmark, MD   100 mg at 01/19/18 2247    Lab Results:  Results for orders placed or performed during the hospital encounter of 01/19/18 (from the past 48 hour(s))  hCG, quantitative, pregnancy     Status: None   Collection Time: 01/19/18  4:47 PM  Result Value Ref Range   hCG, Beta Chain, Quant, S <1 <5 mIU/mL    Comment:          GEST. AGE      CONC.  (mIU/mL)   <=1 WEEK        5 - 50     2 WEEKS       50 - 500     3 WEEKS       100 - 10,000     4 WEEKS     1,000 - 30,000     5 WEEKS     3,500 - 115,000   6-8 WEEKS     12,000 -  270,000    12 WEEKS     15,000 - 220,000        FEMALE AND NON-PREGNANT FEMALE:     LESS THAN 5 mIU/mL Performed at [facility], [address] [place], [place] [address]   Hemoglobin A1c     Status: None   Collection Time: 01/20/18  6:56 AM  Result Value Ref Range   Hgb A1c MFr Bld 4.9 4.8 - 5.6 %    Comment: (NOTE) Pre diabetes:          5.7%-6.4% Diabetes:              >6.4% Glycemic control for   <7.0% adults with diabetes    Mean Plasma Glucose 93.93 mg/dL    Comment: Performed at [facility], [address] [place], [place] [address]  Lipid panel     Status: Abnormal   Collection Time: 01/20/18  6:56 AM  Result Value Ref Range   Cholesterol 182 0 - 200 mg/dL   Triglycerides 93 <696 mg/dL   HDL 55 >29 mg/dL   Total CHOL/HDL Ratio 3.3 RATIO   VLDL 19 0 - 40 mg/dL   LDL Cholesterol 528 (H) 0 - 99 mg/dL    Comment:        Total Cholesterol/HDL:CHD Risk Coronary Heart Disease Risk Table                     Men   Women  1/2 Average Risk   3.4   3.3  Average Risk       5.0   4.4  2 X Average Risk   9.6   7.1  3 X Average Risk  23.4   11.0        Use the calculated Patient Ratio above and the CHD Risk Table to determine the patient's CHD Risk.        ATP III CLASSIFICATION (LDL):  <100     mg/dL   Optimal  413-244  mg/dL   Near or Above  Optimal  130-159  mg/dL   Borderline  161-096  mg/dL   High  >045     mg/dL   Very High Performed at [facility], [address] [place], [place] [address]   TSH     Status: Abnormal   Collection Time: 01/20/18  6:56 AM  Result Value Ref Range   TSH 10.221 (H) 0.350 - 4.500 uIU/mL    Comment: Performed by a 3rd Generation assay with a functional sensitivity of <=0.01 uIU/mL. Performed at [facility], [address] [place], [place] [address]     Blood Alcohol level:  Lab Results  Component Value Date   [facility] <10 01/19/2018    Metabolic Disorder Labs: Lab Results   Component Value Date   HGBA1C 4.9 01/20/2018   MPG 93.93 01/20/2018   No results found for: PROLACTIN Lab Results  Component Value Date   CHOL 182 01/20/2018   TRIG 93 01/20/2018   HDL 55 01/20/2018   CHOLHDL 3.3 01/20/2018   VLDL 19 01/20/2018   LDLCALC 108 (H) 01/20/2018    Physical Findings: AIMS:  , ,  ,  ,    CIWA:    COWS:     Musculoskeletal: Strength & Muscle Tone: within normal limits Gait & Station: normal Patient leans:   Psychiatric Specialty Exam: Physical Exam  Nursing note and vitals reviewed.   ROS  Blood pressure 93/66, pulse (!) 106, temperature 98.1 F (36.7 C), temperature source Oral, resp. rate 16, height 5\' 5"  (1.651 m), weight 63.5 kg, last menstrual period 12/29/2017, SpO2 98 %.Body mass index is 23.3 kg/m.  General Appearance:Fairly Groomed  Eye Contact:Fair  Speech:Slow  Volume:Normal  Mood:Depressed  Affect:Depressed, congruent  Thought Process:goal directed  Orientation:Full (Time, Place, and Person)  Thought Content:Rumination , depressed  Suicidal Thoughts:denies  Homicidal Thoughts:No  Memory:Immediate;Fair Recent;Fair Remote;Fair  Judgement:Impaired  Insight:Shallow  Psychomotor Activity:Normal  Concentration:Concentration:Fair  Recall:Fair  Fund of Knowledge:Fair  Language:Fair  Akathisia:No  Handed:Right  AIMS (if indicated):   Assets:Desire for Improvement Housing Physical Health Resilience  ADL's:Intact  Cognition:WNL  Sleep: 7.25 hrs       Treatment Plan Summary: Daily contact with patient to assess and evaluate symptoms and progress in treatment and Medication management. Pt with h/o depression s/p suicide attempt with OD on pills. Pt depressed, TSH high will rpt TSH with free T4.  Cont  paxil.  Group/milieu tx.  IVC status.   Beverly Sessions, MD 01/21/2018, 1:39 PM

## 2018-01-21 NOTE — BHH Counselor (Signed)
Adult Comprehensive Assessment  Patient ID: Kathleen Robinson, female   DOB: 26-Oct-1995, 22 y.o.   MRN: [id number]  Information Source: Information source: Patient  Current Stressors:  Patient states their primary concerns and needs for treatment are:: "I tried to kills myself, lost all sense of hope" Patient states their goals for this hospitilization and ongoing recovery are:: "long term I want to get to a point of self-confidence" Educational / Learning stressors: none reported Employment / Job issues: none reported Family Relationships: "goodEngineer, petroleum / Lack of resources (include bankruptcy): unemployed Housing / Lack of housing: apartment Physical health (include injuries & life threatening diseases): none reported Social relationships: "good" Substance abuse: none reported Bereavement / Loss: none  reported   Living/Environment/Situation:  Living Arrangements: Alone Who else lives in the home?: n/a How long has patient lived in current situation?: 1.5 months What is atmosphere in current home: Comfortable  Family History:  Marital status: Single Are you sexually active?: No What is your sexual orientation?: bisexual Has your sexual activity been affected by drugs, alcohol, medication, or emotional stress?: no Does patient have children?: No  Childhood History:  By whom was/is the patient raised?: Both parents Additional childhood history information: bio-mom not in her life Description of patient's relationship with caregiver when they were a child: "a little strained" Patient's description of current relationship with people who raised him/her: "good" How were you disciplined when you got in trouble as a child/adolescent?: "a lot better" Does patient have siblings?: Yes Number of Siblings: 2 Description of patient's current relationship with siblings: older brother and younger sister - good relationship Did patient suffer any verbal/emotional/physical/sexual abuse as a  child?: No Did patient suffer from severe childhood neglect?: No Has patient ever been sexually abused/assaulted/raped as an adolescent or adult?: No Was the patient ever a victim of a crime or a disaster?: No Witnessed domestic violence?: No Has patient been effected by domestic violence as an adult?: No  Education:  Highest grade of school patient has completed: college degree Currently a student?: No Learning disability?: No  Employment/Work Situation:   Employment situation: Unemployed Patient's job has been impacted by current illness: No What is the longest time patient has a held a job?: 2 years 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?: No Are There Guns or Other Weapons in Your Home?: No Are These Weapons Safely Secured?: No  Financial Resources:   Financial resources: Income from employment Does patient have a representative payee or guardian?: No  Alcohol/Substance Abuse:   What has been your use of drugs/alcohol within the last 12 months?: pt denies use If attempted suicide, did drugs/alcohol play a role in this?: No Alcohol/Substance Abuse Treatment Hx: Denies past history Has alcohol/substance abuse ever caused legal problems?: No  Social Support System:   Patient's Community Support System: Good Describe Community Support System: family Type of faith/religion: Ephriam Knuckles- non denominational How does patient's faith help to cope with current illness?: praying  Leisure/Recreation:   Leisure and Hobbies: playing video games  Strengths/Needs:   What is the patient's perception of their strengths?: thinking, thinking about thinking, learning Patient states they can use these personal strengths during their treatment to contribute to their recovery: "I think I have a lot of self-awareness, and broader understanding of who I am" Patient states these barriers may affect/interfere  with their treatment: no Patient states these barriers may affect their return to the community: no  Discharge Plan:   Currently receiving community mental health services: No Does patient have access to transportation?: Yes(parents) Does patient have financial barriers related to discharge medications?: No Will patient be returning to same living situation after discharge?: Yes  Summary/Recommendations:   Summary and Recommendations (to be completed by the evaluator): Patient is a 22 year old female admitted involuntarily and diagnosed with Severe recurrent major depression without psychotic features. Patient with a history of depression and mood instability admitted from ED, under IVC. Per report, patient came into the hospital by EMS, she took an overdose of Adderall and Paxil. Patient will benefit from crisis stabilization, medication evaluation, group therapy and psychoeducation. In addition to case management for discharge planning. At discharge it is recommended that patient adhere to the established discharge plan and continue treatment.    Lynn Sissel  CUEBAS-COLON. 01/21/2018

## 2018-01-21 NOTE — BHH Group Notes (Signed)
LCSW Group Therapy Note 01/21/2018 1:15pm  Type of Therapy and Topic: Group Therapy: Feelings Around Returning Home & Establishing a Supportive Framework and Supporting Oneself When Supports Not Available  Participation Level: Active  Description of Group:  Patients first processed thoughts and feelings about upcoming discharge. These included fears of upcoming changes, lack of change, new living environments, judgements and expectations from others and overall stigma of mental health issues. The group then discussed the definition of a supportive framework, what that looks and feels like, and how do to discern it from an unhealthy non-supportive network. The group identified different types of supports as well as what to do when your family/friends are less than helpful or unavailable  Therapeutic Goals  1. Patient will identify one healthy supportive network that they can use at discharge. 2. Patient will identify one factor of a supportive framework and how to tell it from an unhealthy network. 3. Patient able to identify one coping skill to use when they do not have positive supports from others. 4. Patient will demonstrate ability to communicate their needs through discussion and/or role plays.  Summary of Patient Progress:  Patient scored her mood at a 5 (10 best.) Pt engaged during group session. As patients processed their anxiety about discharge and described healthy supports patient shared that she is ready to be discharge. The patient stated "I had a bit of time to think about what I did." Patient listed her family as her main support system. Patients identified at least one self-care tool they were willing to use after discharge; asking for help and witting.   Therapeutic Modalities Cognitive Behavioral Therapy Motivational Interviewing   Robin Petrakis  CUEBAS-COLON, LCSW 01/21/2018 11:55 AM

## 2018-01-21 NOTE — Progress Notes (Signed)
Patient was isolative to her room.  Patient had no complaints but remains flat and had minimal interaction with peers and staff. In bed resting atthis time.

## 2018-01-22 LAB — TSH: TSH: 2.181 u[IU]/mL (ref 0.350–4.500)

## 2018-01-22 LAB — T4, FREE: Free T4: 0.89 ng/dL (ref 0.82–1.77)

## 2018-01-22 MED ORDER — ZOLPIDEM TARTRATE 5 MG PO TABS
5.0000 mg | ORAL_TABLET | Freq: Every day | ORAL | Status: DC
  Administered 2018-01-22: 5 mg via ORAL
  Filled 2018-01-22: qty 1

## 2018-01-22 NOTE — Progress Notes (Signed)
   01/22/18 1115  Clinical Encounter Type  Visited With Patient  Visit Type Initial;Spiritual support;Behavioral Health  Referral From Social work  Consult/Referral To Chaplain  Spiritual Encounters  Spiritual Needs Emotional;Other (Comment)   CH attended the patient's treatment team meeting. The patient stated that she wants to go home to her mother's home to get her thoughts together. She will move closer to her family in [place].

## 2018-01-22 NOTE — Progress Notes (Signed)
Patient pleasant and cooperative. Medication compliant. Denies SI, HI, AVH. Bright on approach. In no distress. Attends group.  Encouragement and support offered. Safety checks maintained. Medications given as prescribed. Pt receptive and remains safe on unit with q 15 min checks.

## 2018-01-22 NOTE — Progress Notes (Signed)
[facility] MD Progress Note  01/22/2018 5:42 AM Kathleen Robinson  MRN:  [id number]  Subjective:    Ms. Kathleen Robinson met with treatment team today. She is no longer suicidal but unable to participate in discharge planning. She was restarted on Paxil and wants to continue.   Principal Problem: Severe recurrent major depression without psychotic features ([place]) Diagnosis:   Patient Active Problem List   Diagnosis Date Noted  . Severe recurrent major depression without psychotic features ([place]) [F33.2] 01/19/2018    Priority: High  . Suicide attempt ([place]) [T14.91XA] 01/19/2018  . Amphetamine overdose [facility]) [T43.621A] 01/19/2018   Total Time spent with patient: 20 minutes  Past Psychiatric History: depression, anxiety  Past Medical History:  Past Medical History:  Diagnosis Date  . Depression    History reviewed. No pertinent surgical history. Family History:  Family History  Problem Relation Age of Onset  . Diabetes Other   . Stroke Other    Family Psychiatric  History: mother with bipolar Social History:  Social History   Substance and Sexual Activity  Alcohol Use Yes   Comment: occassionally      Social History   Substance and Sexual Activity  Drug Use Not on file    Social History   Socioeconomic History  . Marital status: Single    Spouse name: Not on file  . Number of children: Not on file  . Years of education: Not on file  . Highest education level: Not on file  Occupational History  . Not on file  Social Needs  . Financial resource strain: Not on file  . Food insecurity:    Worry: Not on file    Inability: Not on file  . Transportation needs:    Medical: Not on file    Non-medical: Not on file  Tobacco Use  . Smoking status: Never Smoker  . Smokeless tobacco: Never Used  Substance and Sexual Activity  . Alcohol use: Yes    Comment: occassionally   . Drug use: Not on file  . Sexual activity: Not on file  Lifestyle  . Physical activity:    Days per week: Not  on file    Minutes per session: Not on file  . Stress: Not on file  Relationships  . Social connections:    Talks on phone: Not on file    Gets together: Not on file    Attends religious service: Not on file    Active member of club or organization: Not on file    Attends meetings of clubs or organizations: Not on file    Relationship status: Not on file  Other Topics Concern  . Not on file  Social History Narrative  . Not on file   Additional Social History:                         Sleep: Poor  Appetite:  Fair  Current Medications: Current Facility-Administered Medications  Medication Dose Route Frequency Provider Last Rate Last Dose  . acetaminophen (TYLENOL) tablet 650 mg  650 mg Oral Q6H PRN Clapacs, John T, MD      . alum & mag hydroxide-simeth (MAALOX/MYLANTA) 200-200-20 MG/5ML suspension 30 mL  30 mL Oral Q4H PRN Clapacs, John T, MD      . hydrOXYzine (ATARAX/VISTARIL) tablet 50 mg  50 mg Oral TID PRN Clapacs, Madie Reno, MD   50 mg at 01/19/18 2211  . magnesium hydroxide (MILK OF MAGNESIA) suspension 30 mL  30 mL Oral Daily PRN Clapacs, John T, MD      . PARoxetine (PAXIL) tablet 30 mg  30 mg Oral Daily Lenward Chancellor, MD   30 mg at 01/21/18 0749  . traZODone (DESYREL) tablet 100 mg  100 mg Oral QHS PRN Clapacs, Madie Reno, MD   100 mg at 01/19/18 2247    Lab Results:  Results for orders placed or performed during the [facility] encounter of 01/19/18 (from the past 48 hour(s))  Hemoglobin A1c     Status: None   Collection Time: 01/20/18  6:56 AM  Result Value Ref Range   Hgb A1c MFr Bld 4.9 4.8 - 5.6 %    Comment: (NOTE) Pre diabetes:          5.7%-6.4% Diabetes:              >6.4% Glycemic control for   <7.0% adults with diabetes    Mean Plasma Glucose 93.93 mg/dL    Comment: Performed at [facility], [place] [address] [place], [place] [address]  Lipid panel     Status: Abnormal   Collection Time: 01/20/18  6:56 AM  Result Value Ref Range    Cholesterol 182 0 - 200 mg/dL   Triglycerides 93 <150 mg/dL   HDL 55 >40 mg/dL   Total CHOL/HDL Ratio 3.3 RATIO   VLDL 19 0 - 40 mg/dL   LDL Cholesterol 108 (H) 0 - 99 mg/dL    Comment:        Total Cholesterol/HDL:CHD Risk Coronary Heart Disease Risk Table                     Men   Women  1/2 Average Risk   3.4   3.3  Average Risk       5.0   4.4  2 X Average Risk   9.6   7.1  3 X Average Risk  23.4   11.0        Use the calculated Patient Ratio above and the CHD Risk Table to determine the patient's CHD Risk.        ATP III CLASSIFICATION (LDL):  <100     mg/dL   Optimal  100-129  mg/dL   Near or Above                    Optimal  130-159  mg/dL   Borderline  160-189  mg/dL   High  >190     mg/dL   Very High Performed at [facility], [address] [place], [place] [address]   TSH     Status: Abnormal   Collection Time: 01/20/18  6:56 AM  Result Value Ref Range   TSH 10.221 (H) 0.350 - 4.500 uIU/mL    Comment: Performed by a 3rd Generation assay with a functional sensitivity of <=0.01 uIU/mL. Performed at [facility], [address] [place], [place] [address]     Blood Alcohol level:  Lab Results  Component Value Date   [facility] <10 21/30/8657    Metabolic Disorder Labs: Lab Results  Component Value Date   HGBA1C 4.9 01/20/2018   MPG 93.93 01/20/2018   No results found for: PROLACTIN Lab Results  Component Value Date   CHOL 182 01/20/2018   TRIG 93 01/20/2018   HDL 55 01/20/2018   CHOLHDL 3.3 01/20/2018   VLDL 19 01/20/2018   LDLCALC 108 (H) 01/20/2018    Physical Findings: AIMS:  , ,  ,  ,  CIWA:    COWS:     Musculoskeletal: Strength & Muscle Tone: within normal limits Gait & Station: normal Patient leans: N/A  Psychiatric Specialty Exam: Physical Exam  Nursing note and vitals reviewed. Psychiatric: She has a normal mood and affect. Her speech is normal and behavior is normal. Thought content normal. Cognition and memory  are normal. She expresses impulsivity.    Review of Systems  Neurological: Negative.   Psychiatric/Behavioral: Negative.   All other systems reviewed and are negative.   Blood pressure 93/66, pulse (!) 106, temperature 98.1 F (36.7 C), temperature source Oral, resp. rate 16, height _0  (1.651 m), weight 63.5 kg, last menstrual period 12/29/2017, SpO2 98 %.Body mass index is 23.3 kg/m.  General Appearance: Casual  Eye Contact:  Good  Speech:  Clear and Coherent  Volume:  Normal  Mood:  Euthymic  Affect:  Appropriate  Thought Process:  Goal Directed and Descriptions of Associations: Intact  Orientation:  Full (Time, Place, and Person)  Thought Content:  WDL  Suicidal Thoughts:  No  Homicidal Thoughts:  No  Memory:  Immediate;   Fair Recent;   Fair Remote;   Fair  Judgement:  Impaired  Insight:  Shallow  Psychomotor Activity:  Normal  Concentration:  Concentration: Fair and Attention Span: Fair  Recall:  [employer] of Knowledge:  Fair  Language:  Fair  Akathisia:  No  Handed:  Right  AIMS (if indicated):     Assets:  Communication Skills Desire for Improvement Financial Resources/Insurance Housing Physical Health Resilience Social Support Transportation Vocational/Educational  ADL's:  Intact  Cognition:  WNL  Sleep:  Number of Hours: 7.25     Treatment Plan Summary: Daily contact with patient to assess and evaluate symptoms and progress in treatment and Medication management   Ms. Lizardo is a 22 year old female with a hitory of depression admitted after overdose.  #Suicidalideation -patient able to contract formsafety in the [place]  #Mood -continue Paxil 30 mg -Ambien 5 mg nightly  #ADHD -hold Adderall after overdose  #Disposition -discharge to home -follow up   Orson Slick, MD 01/22/2018, 5:42 AM

## 2018-01-22 NOTE — Progress Notes (Signed)
Patient alert and oriented x 4, no distress noted ,interacting appropriately with peers and staff, complaint with medication, didn't attend evening wrap up group, isolated most of the shift to her room, 15 minutes safety checks maintained will continue to monitor

## 2018-01-22 NOTE — Plan of Care (Signed)
  Problem: Education: Goal: Knowledge of [facility] Education information/materials will improve Outcome: Progressing Goal: Emotional status will improve Outcome: Progressing Goal: Mental status will improve Outcome: Progressing Goal: Verbalization of understanding the information provided will improve Outcome: Progressing   Problem: Coping: Goal: Coping ability will improve Outcome: Progressing   

## 2018-01-22 NOTE — Tx Team (Signed)
Interdisciplinary Treatment and Diagnostic Plan Update  01/22/2018  Time of Session: 10:30am Kathleen Robinson MRN: [id number]  Principal Diagnosis: Severe recurrent major depression without psychotic features [facility])  Secondary Diagnoses: Principal Problem:   Severe recurrent major depression without psychotic features (HCC) Active Problems:   Suicide attempt (HCC)   Amphetamine overdose (HCC)   Current Medications:  Current Facility-Administered Medications  Medication Dose Route Frequency Provider Last Rate Last Dose  . acetaminophen (TYLENOL) tablet 650 mg  650 mg Oral Q6H PRN Clapacs, John T, MD      . alum & mag hydroxide-simeth (MAALOX/MYLANTA) 200-200-20 MG/5ML suspension 30 mL  30 mL Oral Q4H PRN Clapacs, John T, MD      . hydrOXYzine (ATARAX/VISTARIL) tablet 50 mg  50 mg Oral TID PRN Clapacs, Jackquline Denmark, MD   50 mg at 01/19/18 2211  . magnesium hydroxide (MILK OF MAGNESIA) suspension 30 mL  30 mL Oral Daily PRN Clapacs, John T, MD      . PARoxetine (PAXIL) tablet 30 mg  30 mg Oral Daily Beverly Sessions, MD   30 mg at 01/22/18 0803  . traZODone (DESYREL) tablet 100 mg  100 mg Oral QHS PRN Clapacs, Jackquline Denmark, MD   100 mg at 01/19/18 2247   PTA Medications: Medications Prior to Admission  Medication Sig Dispense Refill Last Dose  . ADDERALL XR 20 MG 24 hr capsule Take 20 mg by mouth daily.  0 01/18/2018 at 2300  . PARoxetine (PAXIL) 20 MG tablet Take 20 mg by mouth every morning.  3 01/18/2018 at 2300    Patient Stressors: Financial difficulties Loss of break of with signifigant other Occupational concerns  Patient Strengths: Ability for insight Capable of independent living Communication skills Motivation for treatment/growth  Treatment Modalities: Medication Management, Group therapy, Case management,  1 to 1 session with clinician, Psychoeducation, Recreational therapy.   Physician Treatment Plan for Primary Diagnosis: Severe recurrent major depression without psychotic  features (HCC) Long Term Goal(s): Improvement in symptoms so as ready for discharge Improvement in symptoms so as ready for discharge   Short Term Goals: Ability to identify changes in lifestyle to reduce recurrence of condition will improve Ability to verbalize feelings will improve Ability to disclose and discuss suicidal ideas Ability to demonstrate self-control will improve Ability to identify and develop effective coping behaviors will improve Ability to maintain clinical measurements within normal limits will improve Compliance with prescribed medications will improve Ability to identify triggers associated with substance abuse/mental health issues will improve Ability to identify changes in lifestyle to reduce recurrence of condition will improve Ability to verbalize feelings will improve Ability to disclose and discuss suicidal ideas Ability to demonstrate self-control will improve Ability to identify and develop effective coping behaviors will improve Ability to maintain clinical measurements within normal limits will improve Compliance with prescribed medications will improve Ability to identify triggers associated with substance abuse/mental health issues will improve  Medication Management: Evaluate patient's response, side effects, and tolerance of medication regimen.  Therapeutic Interventions: 1 to 1 sessions, Unit Group sessions and Medication administration.  Evaluation of Outcomes: Progressing  Physician Treatment Plan for Secondary Diagnosis: Principal Problem:   Severe recurrent major depression without psychotic features (HCC) Active Problems:   Suicide attempt (HCC)   Amphetamine overdose (HCC)  Long Term Goal(s): Improvement in symptoms so as ready for discharge Improvement in symptoms so as ready for discharge   Short Term Goals: Ability to identify changes in lifestyle to reduce recurrence of condition will improve Ability  to verbalize feelings will  improve Ability to disclose and discuss suicidal ideas Ability to demonstrate self-control will improve Ability to identify and develop effective coping behaviors will improve Ability to maintain clinical measurements within normal limits will improve Compliance with prescribed medications will improve Ability to identify triggers associated with substance abuse/mental health issues will improve Ability to identify changes in lifestyle to reduce recurrence of condition will improve Ability to verbalize feelings will improve Ability to disclose and discuss suicidal ideas Ability to demonstrate self-control will improve Ability to identify and develop effective coping behaviors will improve Ability to maintain clinical measurements within normal limits will improve Compliance with prescribed medications will improve Ability to identify triggers associated with substance abuse/mental health issues will improve     Medication Management: Evaluate patient's response, side effects, and tolerance of medication regimen.  Therapeutic Interventions: 1 to 1 sessions, Unit Group sessions and Medication administration.  Evaluation of Outcomes: Progressing   RN Treatment Plan for Primary Diagnosis: Severe recurrent major depression without psychotic features (HCC) Long Term Goal(s): Knowledge of disease and therapeutic regimen to maintain health will improve  Short Term Goals: Ability to verbalize feelings will improve, Ability to identify and develop effective coping behaviors will improve and Compliance with prescribed medications will improve  Medication Management: RN will administer medications as ordered by provider, will assess and evaluate patient's response and provide education to patient for prescribed medication. RN will report any adverse and/or side effects to prescribing provider.  Therapeutic Interventions: 1 on 1 counseling sessions, Psychoeducation, Medication administration,  Evaluate responses to treatment, Monitor vital signs and CBGs as ordered, Perform/monitor CIWA, COWS, AIMS and Fall Risk screenings as ordered, Perform wound care treatments as ordered.  Evaluation of Outcomes: Progressing   LCSW Treatment Plan for Primary Diagnosis: Severe recurrent major depression without psychotic features (HCC) Long Term Goal(s): Safe transition to appropriate next level of care at discharge, Engage patient in therapeutic group addressing interpersonal concerns.  Short Term Goals: Engage patient in aftercare planning with referrals and resources, Increase social support, Identify triggers associated with mental health/substance abuse issues and Increase skills for wellness and recovery  Therapeutic Interventions: Assess for all discharge needs, 1 to 1 time with Social worker, Explore available resources and support systems, Assess for adequacy in community support network, Educate family and significant other(s) on suicide prevention, Complete Psychosocial Assessment, Interpersonal group therapy.  Evaluation of Outcomes: Progressing   Progress in Treatment: Attending groups: Yes. Participating in groups: Yes. Taking medication as prescribed: Yes. Toleration medication: Yes. Family/Significant other contact made: No, will contact:  Family member when consented Patient understands diagnosis: Yes. Discussing patient identified problems/goals with staff: Yes. Medical problems stabilized or resolved: Yes. Denies suicidal/homicidal ideation: Yes. Issues/concerns per patient self-inventory: No. Other:   New problem(s) identified: No, Describe:  None  New Short Term/Long Term Goal(s):  "To move with family, take my medications and get into therapy."  Patient Goals:  "To move with family, take my medications and get into therapy."  Discharge Plan or Barriers: To discharge to her family and follow up with outpatient services.  Reason for Continuation of  Hospitalization: Depression Medication stabilization  Estimated Length of Stay: 4 days  Attendees: Patient: Kathleen Robinson 01/22/2018 10:55 AM  Physician: Dr. Jennet Maduro, MD/Elon Student 01/22/2018 10:55 AM  Nursing: Hulan Amato, RN 01/22/2018 10:55 AM  RN Care Manager: 01/22/2018 10:55 AM  Social Worker: Johny Shears, LCSWA 01/22/2018 10:55 AM  Recreational Therapist: Danella Deis. Outlaw CTRS, LRT 01/22/2018 10:55 AM  Other:  Jake Shark, LCSW 01/22/2018 10:55 AM  Other: Damian Leavell, Chaplin 01/22/2018 10:55 AM  Other: 01/22/2018 10:55 AM    Scribe for Treatment Team: Johny Shears, LCSW 01/22/2018 10:55 AM

## 2018-01-22 NOTE — [facility] Group Notes (Signed)
[facility] Group Notes:  (Nursing/MHT/Case Management/Adjunct)  Date:  01/22/2018  Time:  9:48 AM  Type of Therapy:  Psychoeducational Skills  Participation Level:  Active  Participation Quality:  Appropriate, Attentive, Sharing and Supportive  Affect:  Appropriate  Cognitive:  Alert, Appropriate and Oriented  Insight:  Good  Engagement in Group:  Engaged  Modes of Intervention:  Clarification, Discussion, Education and Problem-solving  Summary of Progress/Problems:  Kathleen Robinson 01/22/2018, 9:48 AM

## 2018-01-22 NOTE — Progress Notes (Signed)
Recreation Therapy Notes  Date: 01/22/2018  Time: 9:30 am  Location: Craft Room  Behavioral response: Appropriate   Intervention Topic: Team Work  Discussion/Intervention:  Group content on today was focused on teamwork. The group identified what teamwork is. Individuals described who is a part of their team. Patients expressed why they thought teamwork is important. The group stated reasons why they thought it was easier to work with a Comptroller team. Individuals discussed some positives and negatives of working with a team. Patients gave examples of past experiences they had while working with a team. The group participated in the intervention "Story in a bag", patients were in groups and were able to test their skill in a team setting.  Clinical Observations/Feedback:  Patient attended group and defined team work as Secretary/administrator. Individual was social with peers and staff while participating in the intervention.  Darrick Greenlaw LRT/CTRS         Liliahna Cudd 01/22/2018 12:30 PM

## 2018-01-22 NOTE — [facility] Suicide Risk Assessment (Signed)
[facility] INPATIENT:  Family/Significant Other Suicide Prevention Education  Suicide Prevention Education:  Education Completed; Kathleen Robinson Massett/father/[id number] has been identified by the patient as the family member/significant other with whom the patient will be residing, and identified as the person(s) who will aid the patient in the event of a mental health crisis (suicidal ideations/suicide attempt).  With written consent from the patient, the family member/significant other has been provided the following suicide prevention education, prior to the and/or following the discharge of the patient.  The suicide prevention education provided includes the following:  Suicide risk factors  Suicide prevention and interventions  National Suicide Hotline telephone number  [facility] assessment telephone number  [facility] Emergency Assistance 911  [facility] and/or Residential Mobile Crisis Unit telephone number  Request made of family/significant other to:  Remove weapons (e.g., guns, rifles, knives), all items previously/currently identified as safety concern.    Remove drugs/medications (over-the-counter, prescriptions, illicit drugs), all items previously/currently identified as a safety concern.  The family member/significant other verbalizes understanding of the suicide prevention education information provided.  The family member/significant other agrees to remove the items of safety concern listed above.  Kathleen Robinson reports that she has had mental health issues since the age of 1. She has had counseling and medication management in the past. He says that she went to [place] while she was in college. She struggled to get into graduate school. He also reports that she is bisexual and uses that as a distraction to other people. He says that she did find a job in [employer] and doesn't have any stable contacts that live in the area. He reports that after 5 weeks she quit.  During this time, her girlfriend also broke up with her. He feels that all of these factors led to her being unstable. He reports that she does not any access to any guns/weapons.  Johny Shears 01/22/2018, 2:54 PM

## 2018-01-23 DIAGNOSIS — F909 Attention-deficit hyperactivity disorder, unspecified type: Secondary | ICD-10-CM | POA: Diagnosis present

## 2018-01-23 MED ORDER — ADDERALL XR 20 MG PO CP24: 20.0000 mg | ORAL_CAPSULE | Freq: Every day | ORAL | 0 refills | Status: DC

## 2018-01-23 MED ORDER — AMPHETAMINE-DEXTROAMPHET ER 5 MG PO CP24
20.0000 mg | ORAL_CAPSULE | Freq: Every day | ORAL | Status: DC
  Administered 2018-01-23: 20 mg via ORAL
  Filled 2018-01-23: qty 4

## 2018-01-23 MED ORDER — PAROXETINE HCL 30 MG PO TABS: 30.0000 mg | ORAL_TABLET | Freq: Every day | ORAL | 1 refills | Status: DC

## 2018-01-23 NOTE — Progress Notes (Signed)
  [facility] Adult Case Management Discharge Plan :  Will you be returning to the same living situation after discharge:  Yes,  Home or with family At discharge, do you have transportation home?: Yes,  Patient will call for ride or Taxi will be provided Do you have the ability to pay for your medications: Yes,  Insurance  Release of information consent forms completed and in the chart;  Patient's signature needed at discharge.  Patient to Follow up at: Follow-up Information    [facility] Follow up.   Why:  Here is another option avalible to you. Thank you. Contact information: Address: [address] [facility] [place], [place] [address] Hours:  Monday 8AM-5PM Tuesday 8AM-5PM Wednesday 8AM-5PM Thursday 8AM-5PM Friday 8AM-5PM Saturday Closed Sunday Closed Phone: [phone]       [facility] Follow up.   Why:  If you decide to stay in the area, here are additional options avalible to you. Thank you. Contact information: [address] [place] [address] [phone]        [facility] Follow up.   Why:  If you decide to stay in the area, here are additional options avalible to you. Thank you. Contact information: [address] [place] [address]       [facility] Follow up.   Contact information: [address], [place], [place] [address] Phone # [phone] Fax # [phone]       [facility]. Go on 01/30/2018.   Why:  Please follow up on Tuesday January 30, 2018 at 11am. Please bring your discharge paperwork, insurance information, ID, and all medications that you are taking. Thank you. Contact information: Address: [address] [place], [place] [address] Hours: Monday 8AM-5PM  Tuesday 8AM-5PM Wednesday 8AM-5PM Thursday 8AM-5PM Friday 8AM-5PM Saturday Closed Sunday Closed  Phone: [phone]          Next level of care provider has access to [facility] Link:no  Safety Planning and Suicide Prevention discussed: Yes,  Completed with patient and her mother  Have you used any form of tobacco in the last 30 days? (Cigarettes, Smokeless Tobacco, Cigars, and/or Pipes): No  Has patient been referred to the Quitline?: N/A patient is not a smoker  Patient has been referred for addiction treatment: Yes  Johny Shears, LCSW 01/23/2018, 9:49 AM

## 2018-01-23 NOTE — Progress Notes (Signed)
Nursing note 7p-7a  Pt observed interacting with peers on unit this shift. Displayed a bright affect and pleasant mood upon interaction with this Clinical research associate. Pt denies pain ,denies SI/HI, and also denies any audio or visual hallucinations at this time. Pt complains of insomnia, see MAR for medication administration. Pt is able to verbally contract for safety with this RN. Goal: "to write something good about myself in my journal" Pt is now resting in bed with eyes closed, with no signs or symptoms of pain or distress noted. Pt continues to remain safe on the unit and is observed by rounding every 15 min. RN will continue to monitor.

## 2018-01-23 NOTE — Discharge Summary (Signed)
Physician Discharge Summary Note  Patient:  Kathleen Robinson is an 22 y.o., female MRN:  [id number] DOB:  [birth year] Patient phone:  [phone] (home)  Patient address:   [address] [place] [address],  Total Time spent with patient: 20 minutes plus 15 min on care coordination and documentation.  Date of Admission:  01/19/2018 Date of Discharge: 01/23/2018  Reason for Admission:  Overdose.   History of Present Illness:   Chief complaint. "I took pills"  Pt is 22 year old woman with h/o depression and mood instability admitted from ED, under IVC. Per report, pt came into the hospital by EMS, she took an overdose of Adderall and Paxil on Fri,  approximately 20 or more of her Adderall 20 mg.  She took the overdose of pills and did not tell anyone immediately. Only after a while did she make some suggestive comments online to a friend and it was that person who got her attention to call 911.Reports  depressed mood for few weeks,  getting worse, recently had a breakup with her girlfriend. reports poor sleep and appetite.  Patient continues to state suicidal ideation, no plan, contracts for safety. Denies any psychotic symptoms. Denies that she was drinking or abusing drugs. She is not currently seeing a mental health provider but takes Adderall and Paxil prescribed by her primary care doctor.  Past Psychiatric History:  depression and mood instability for several yrs.  Has had serious suicidal thoughts in the past and has been thinking about it more consistently for months now. No previous psychiatric hospitalization or admission. No previous suicide attempts. No history of psychosis or mania.  Family Psychiatric  History: grandfather who attempted suicide more than once  Social History:    Patient is living by herself. Works Agricultural consultant , Recently broke up with a girlfriend.  Principal Problem: Severe recurrent major depression without psychotic features  [facility]) Discharge Diagnoses: Patient Active Problem List   Diagnosis Date Noted  . Severe recurrent major depression without psychotic features (HCC) [F33.2] 01/19/2018    Priority: High  . Attention deficit hyperactivity disorder (ADHD) [F90.9] 01/23/2018  . Suicide attempt [facility]) [T14.91XA] 01/19/2018   Past Medical History:  Past Medical History:  Diagnosis Date  . Depression    History reviewed. No pertinent surgical history. Family History:  Family History  Problem Relation Age of Onset  . Diabetes Other   . Stroke Other    Social History:  Social History   Substance and Sexual Activity  Alcohol Use Yes   Comment: occassionally      Social History   Substance and Sexual Activity  Drug Use Not on file    Social History   Socioeconomic History  . Marital status: Single    Spouse name: Not on file  . Number of children: Not on file  . Years of education: Not on file  . Highest education level: Not on file  Occupational History  . Not on file  Social Needs  . Financial resource strain: Not on file  . Food insecurity:    Worry: Not on file    Inability: Not on file  . Transportation needs:    Medical: Not on file    Non-medical: Not on file  Tobacco Use  . Smoking status: Never Smoker  . Smokeless tobacco: Never Used  Substance and Sexual Activity  . Alcohol use: Yes    Comment: occassionally   . Drug use: Not on file  . Sexual activity: Not on file  Lifestyle  . Physical activity:    Days per week: Not on file    Minutes per session: Not on file  . Stress: Not on file  Relationships  . Social connections:    Talks on phone: Not on file    Gets together: Not on file    Attends religious service: Not on file    Active member of club or organization: Not on file    Attends meetings of clubs or organizations: Not on file    Relationship status: Not on file  Other Topics Concern  . Not on file  Social History Narrative  . Not on file    Hospital  Course:    [name] is a 22 year old female with a hitory of depression and ADHD admitted after overdose in the context of relationship problem. We restarted Paxil and Adderall. She tolerated it well. At the time of discharge, the patient is no longer suicidal or homicidal. She is able to contract for safety. She is forward thinking and optimistic about the future.   #Mood, improved -continue Paxil 30 mg daily   #ADHD -continue Adderall 20 mg daily  #Disposition -discharge to home -follow up with new provider  Physical Findings: AIMS: Facial and Oral Movements Muscles of Facial Expression: None, normal Lips and Perioral Area: None, normal Jaw: None, normal Tongue: None, normal,Extremity Movements Upper (arms, wrists, hands, fingers): None, normal Lower (legs, knees, ankles, toes): None, normal, Trunk Movements Neck, shoulders, hips: None, normal, Overall Severity Severity of abnormal movements (highest score from questions above): None, normal Incapacitation due to abnormal movements: None, normal Patient's awareness of abnormal movements (rate only patient's report): No Awareness, Dental Status Current problems with teeth and/or dentures?: No Does patient usually wear dentures?: No  CIWA:    COWS:     Musculoskeletal: Strength & Muscle Tone: within normal limits Gait & Station: normal Patient leans: N/A  Psychiatric Specialty Exam: Physical Exam  Nursing note and vitals reviewed. Psychiatric: She has a normal mood and affect. Her speech is normal and behavior is normal. Judgment and thought content normal. Cognition and memory are normal.    Review of Systems  Neurological: Negative.   Psychiatric/Behavioral: Negative.   All other systems reviewed and are negative.   Blood pressure (!) 102/58, pulse 77, temperature 98.8 F (37.1 C), temperature source Oral, resp. rate 16, height 5\' 5"  (1.651 m), weight 63.5 kg, last menstrual period 12/29/2017, SpO2 100 %.Body  mass index is 23.3 kg/m.  General Appearance: Casual  Eye Contact:  Good  Speech:  Clear and Coherent  Volume:  Normal  Mood:  Euthymic  Affect:  Appropriate  Thought Process:  Goal Directed and Descriptions of Associations: Intact  Orientation:  Full (Time, Place, and Person)  Thought Content:  WDL  Suicidal Thoughts:  No  Homicidal Thoughts:  No  Memory:  Immediate;   Fair Recent;   Fair Remote;   Fair  Judgement:  Impaired  Insight:  Present  Psychomotor Activity:  Normal  Concentration:  Concentration: Fair and Attention Span: Fair  Recall:  [employer] of Knowledge:  Fair  Language:  Fair  Akathisia:  No  Handed:  Right  AIMS (if indicated):     Assets:  Communication Skills Desire for Improvement Financial Resources/Insurance Housing Physical Health Resilience Social Support Talents/Skills Transportation Vocational/Educational  ADL's:  Intact  Cognition:  WNL  Sleep:  Number of Hours: 7.5     Have you used any form of tobacco in the  last 30 days? (Cigarettes, Smokeless Tobacco, Cigars, and/or Pipes): No  Has this patient used any form of tobacco in the last 30 days? (Cigarettes, Smokeless Tobacco, Cigars, and/or Pipes) Yes, No  Blood Alcohol level:  Lab Results  Component Value Date   ETH <10 01/19/2018    Metabolic Disorder Labs:  Lab Results  Component Value Date   HGBA1C 4.9 01/20/2018   MPG 93.93 01/20/2018   No results found for: PROLACTIN Lab Results  Component Value Date   CHOL 182 01/20/2018   TRIG 93 01/20/2018   HDL 55 01/20/2018   CHOLHDL 3.3 01/20/2018   VLDL 19 01/20/2018   LDLCALC 108 (H) 01/20/2018    See Psychiatric Specialty Exam and Suicide Risk Assessment completed by Attending Physician prior to discharge.  Discharge destination:  Home  Is patient on multiple antipsychotic therapies at discharge:  No   Has Patient had three or more failed trials of antipsychotic monotherapy by history:  No  Recommended Plan for  Multiple Antipsychotic Therapies: NA  Discharge Instructions    Diet - low sodium heart healthy   Complete by:  As directed    Increase activity slowly   Complete by:  As directed      Allergies as of 01/23/2018      Reactions   Wasp Venom Swelling      Medication List    TAKE these medications     Indication  ADDERALL XR 20 MG 24 hr capsule Generic drug:  amphetamine-dextroamphetamine Take 1 capsule (20 mg total) by mouth daily.  Indication:  Attention Deficit Hyperactivity Disorder   PARoxetine 30 MG tablet Commonly known as:  PAXIL Take 1 tablet (30 mg total) by mouth daily. Start taking on:  01/24/2018 What changed:    medication strength  how much to take  when to take this  Indication:  Major Depressive Disorder      Follow-up Information    [facility] Follow up.   Why:  Here is another option avalible to you. Thank you. Contact information: Address: [address] [facility] [place], [place] [address] Hours:  Monday 8AM-5PM Tuesday 8AM-5PM Wednesday 8AM-5PM Thursday 8AM-5PM Friday 8AM-5PM Saturday Closed Sunday Closed Phone: [phone]       [facility] Follow up.   Why:  If you decide to stay in the area, here are additional options avalible to you. Thank you. Contact information: [address] [place] [address] [phone]        [facility] Follow up.   Why:  If you decide to stay in the area, here are additional options avalible to you. Thank you. Contact information: [address] [place] [address]       [facility] Follow up.   Contact information: [address], [place], [place] [address] Phone # [phone] Fax # [phone]       [facility]. Go on 01/30/2018.   Why:  Please follow up on Tuesday January 30, 2018 at 11am. Please bring your discharge paperwork, insurance information, ID, and all medications that  you are taking. Thank you. Contact information: Address: [address] [place], [place] [address] Hours: Monday 8AM-5PM  Tuesday 8AM-5PM Wednesday 8AM-5PM Thursday 8AM-5PM Friday 8AM-5PM Saturday Closed Sunday Closed  Phone: [phone]          Follow-up recommendations:  Activity:  as tolerated Diet:  regular Other:  keep follow up appointments  Comments:  Signed: Kristine Linea, MD 01/23/2018, 9:52 AM

## 2018-01-23 NOTE — BHH Suicide Risk Assessment (Addendum)
[facility] Discharge Suicide Risk Assessment   Principal Problem: Severe recurrent major depression without psychotic features [facility]) Discharge Diagnoses:  Patient Active Problem List   Diagnosis Date Noted  . Severe recurrent major depression without psychotic features (HCC) [F33.2] 01/19/2018    Priority: High  . Attention deficit hyperactivity disorder (ADHD) [F90.9] 01/23/2018  . Suicide attempt [facility]) [T14.91XA] 01/19/2018    Total Time spent with patient: 20 minutes  Musculoskeletal: Strength & Muscle Tone: within normal limits Gait & Station: normal Patient leans: N/A  Psychiatric Specialty Exam: Review of Systems  Neurological: Negative.   Psychiatric/Behavioral: Negative.   All other systems reviewed and are negative.   Blood pressure (!) 102/58, pulse 77, temperature 98.8 F (37.1 C), temperature source Oral, resp. rate 16, height 5\' 5"  (1.651 m), weight 63.5 kg, last menstrual period 12/29/2017, SpO2 100 %.Body mass index is 23.3 kg/m.  General Appearance: Casual  Eye Contact::  Good  Speech:  Clear and Coherent409  Volume:  Normal  Mood:  Euthymic  Affect:  Appropriate  Thought Process:  Goal Directed and Descriptions of Associations: Intact  Orientation:  Full (Time, Place, and Person)  Thought Content:  WDL  Suicidal Thoughts:  No  Homicidal Thoughts:  No  Memory:  Immediate;   Fair Recent;   Fair Remote;   Fair  Judgement:  Impaired  Insight:  Present  Psychomotor Activity:  Normal  Concentration:  Fair  Recall:  [employer] of Knowledge:Fair  Language: Fair  Akathisia:  No  Handed:  Right  AIMS (if indicated):     Assets:  Communication Skills Desire for Improvement Financial Resources/Insurance Housing Physical Health Resilience Social Support Talents/Skills Transportation Vocational/Educational  Sleep:  Number of Hours: 7.5  Cognition: WNL  ADL's:  Intact   Mental Status Per Nursing Assessment::   On Admission:  Suicidal ideation indicated  by patient  Demographic Factors:  Adolescent or young adult, Caucasian, Living alone and Unemployed  Loss Factors: Decrease in vocational status and Loss of significant relationship  Historical Factors: Family history of suicide, Family history of mental illness or substance abuse and Impulsivity  Risk Reduction Factors:   Sense of responsibility to family and Positive social support  Continued Clinical Symptoms:  Depression:   Impulsivity  Cognitive Features That Contribute To Risk:  None    Suicide Risk:  Minimal: No identifiable suicidal ideation.  Patients presenting with no risk factors but with morbid ruminations; may be classified as minimal risk based on the severity of the depressive symptoms  Follow-up Information    [facility] Follow up.   Contact information: Address: [address] [facility] [place], [place] [address] Hours:  Monday 8AM-5PM Tuesday 8AM-5PM Wednesday 8AM-5PM Thursday 8AM-5PM Friday 8AM-5PM Saturday Closed Sunday Closed Phone: [phone]       [facility] Follow up.   Contact information: [address] [place] [address] [phone]        [facility] Follow up.   Contact information: [address] [place] [address]       [facility] Follow up.   Contact information: [address], [place], [place] [address] Phone # [phone] Fax # [phone]          Plan Of Care/Follow-up recommendations:  Activity:  as tolerated Diet:  regular Other:  keep follow up appointments    Kristine Linea, MD 01/23/2018, 9:47 AM

## 2018-01-23 NOTE — Progress Notes (Signed)
Patient alert and oriented x 4. Patient present in the milieu with a bright affect upon this writer's arrival to the unit. Denies having any thoughts of wanting to harm herself, denies HI/AVH and pain at this time. Patient is compliant with her treatment plan. Reports that she slept good last night with the use of an sleep aid. Energy level and concentration is good. Milieu remains safe with q 15 minute safety checks. Patient is scheduled to discharge today.

## 2018-01-23 NOTE — Progress Notes (Signed)
Recreation Therapy Notes  Date: 01/23/2018  Time: 9:30 am   Location: Craft room   Behavioral response: N/A   Intervention Topic: Coping skills  Discussion/Intervention: Patient did not attend group.   Clinical Observations/Feedback:  Patient did not attend group.   Larinda Herter LRT/CTRS         Hillarie Harrigan 01/23/2018 10:48 AM 

## 2018-01-23 NOTE — Progress Notes (Signed)
Patient alert and oriented x 4. Ambulates unit with steady gait. Verbally denies SI/HI/AVH and pain. Patient discharged on above sate and time. Verbalized understanding the discharge information provided to patient upon discharge. Patient departed unit with discharge paperwork, prescriptions and personal belongings. Picked up by her friend.Patient alert and oriented x 4. Ambulates unit with steady gait. Verbally denies SI/HI/AVH and pain.

## 2018-01-23 NOTE — Plan of Care (Signed)
  Problem: Education: Goal: Knowledge of [facility] Education information/materials will improve Outcome: Progressing Goal: Emotional status will improve Outcome: Progressing Goal: Mental status will improve Outcome: Progressing Goal: Verbalization of understanding the information provided will improve Outcome: Progressing   Problem: Coping: Goal: Coping ability will improve Outcome: Progressing Goal: Will verbalize feelings Outcome: Progressing   Problem: Safety: Goal: Ability to remain free from injury will improve Outcome: Progressing

## 2018-02-16 ENCOUNTER — Encounter: Payer: Self-pay | Admitting: Family Medicine

## 2018-02-16 ENCOUNTER — Ambulatory Visit (INDEPENDENT_AMBULATORY_CARE_PROVIDER_SITE_OTHER): Payer: PRIVATE HEALTH INSURANCE | Admitting: Family Medicine

## 2018-02-16 VITALS — BP 122/70 | HR 102 | Temp 97.4°F | Ht 65.5 in | Wt 141.1 lb

## 2018-02-16 DIAGNOSIS — F332 Major depressive disorder, recurrent severe without psychotic features: Secondary | ICD-10-CM

## 2018-02-16 DIAGNOSIS — F902 Attention-deficit hyperactivity disorder, combined type: Secondary | ICD-10-CM

## 2018-02-16 DIAGNOSIS — Z7689 Persons encountering health services in other specified circumstances: Secondary | ICD-10-CM

## 2018-02-16 NOTE — Progress Notes (Signed)
Subjective:    Patient ID: Kathleen Robinson, female    DOB: 1995/08/27, 22 y.o.   MRN: [id number]  HPI This is a 22 yo female who presents today to establish care. Moved her here from [place] for a job. Job did not work out. 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 Good support from parents. Friends locally. Started volunteering at [facility] this week and is hoping to adopt a cat soon.   Depression- had a suicide attempt last month following a difficult break up and job problems. Is doing better. Has been seeing therapist in [place] when she went back home. Has at least 2 prescriptions for her Paxil and Adderall. Her prior therapist is helping her locate someone locally.   Last CPE- August Pap- August Td-11/17/2015 Flu- annual, has had this year Eye- wears glasses Dental- over 1 year, looking for new dentis Exercise- regular Sleep- occasional difficulty falling asleep, thinks related to comning back to her apartment  ROS- no headaches, no chest pain, no SOB, history constipation with IBS- BM every other day, watches diet  Past Medical History:  Diagnosis Date  . Depression    No past surgical history on file. Family History  Problem Relation Age of Onset  . Diabetes Other   . Stroke Other   . Depression Mother   . Hyperlipidemia Father    Social History   Tobacco Use  . Smoking status: Never Smoker  . Smokeless tobacco: Never Used  Substance Use Topics  . Alcohol use: Yes    Comment: occassionally   . Drug use: Never      Review of Systems Per HPI    Objective:   Physical Exam  Constitutional: She is oriented to person, place, and time. She appears well-developed and well-nourished. No distress.  HENT:  Head: Normocephalic and atraumatic.  Mouth/Throat: Oropharynx is clear and moist.  Neck: Normal range of motion. Neck supple.  Cardiovascular: Normal rate, regular rhythm and normal heart sounds.   Pulmonary/Chest: Effort normal and breath sounds normal.  Neurological: She is alert and oriented to person, place, and time.  Skin: Skin is warm and dry. She is not diaphoretic.  Psychiatric: She has a normal mood and affect. Her behavior is normal. Judgment and thought content normal.  Vitals reviewed.     BP 122/70 (BP Location: Right Arm, Patient Position: Sitting, Cuff Size: Normal)   Pulse (!) 102   Temp (!) 97.4 F (36.3 C) (Oral)   Ht 5' 5.5" (1.664 m)   Wt 141 lb 1.9 oz (64 kg)   LMP 12/29/2017 (LMP Unknown)   SpO2 99%   BMI 23.13 kg/m  Depression screen [facility] 2/9 02/16/2018  Decreased Interest 0  Down, Depressed, Hopeless 2  PHQ - 2 Score 2  Altered sleeping 1  Tired, decreased energy 1  Change in appetite 1  Feeling bad or failure about yourself  3  Trouble concentrating 1  Moving slowly or fidgety/restless 0  Suicidal thoughts 0  PHQ-9 Score 9      Assessment & Plan:  1. Encounter to establish care - requested outside records - reviewed [facility]- UTD on immunizations  2. Attention deficit hyperactivity disorder (ADHD), combined type - she will follow up with me before she runs out of her Adderall XR 20 mg (2-3 months)  3. Severe recurrent major depression without psychotic features (HCC) - stable currently - looking for new therapist, she will let  me know if she needs referral - continue paroxetine 30 mg - encouraged healthy food choices, regular outside exercise, adequate sleep and maintaining social/family ties   [name], FNP-BC  [facility], [facility]  02/16/2018 10:21 AM

## 2018-06-15 ENCOUNTER — Telehealth: Payer: Self-pay

## 2018-06-15 NOTE — Telephone Encounter (Signed)
Discussed with triage nurse. Agree with plan for office visit first thing Monday am.

## 2018-06-15 NOTE — Telephone Encounter (Signed)
Pt called to get appt so she can get her Adderall XR and paxil refilled. Pt said she has been out of med for 5 days. Pt said she had meds from previous doctor so she just ran out few days ago. Pt said she has been so busy that she has not been able to schedule appt with Harlin Heys for FU and med refills. Pt is crying on and off during conversation. Pt lives behind [facility] in [place] but pt works in [place] and pt is at work now. Pt said she does not feel in danger and no SI/HI. Pt established care with Harlin Heys FNP 02/16/18 and was admitted to Behavioral health on 01/19/18. I asked pt to hold on for me to speak with Harlin Heys FNP about any possible med refill of small qty over weekend. Pt said not to bother because she would not have time to pick up this weekend. Pt said she would have a friend stay with her this weekend. Pt is aware if feels like harm herself or anyone else will call 911 or have friend take her to ED. t scheduled appt on 06/18/18 at 8 AM and will be here 10 ' early to ck in. FYI to Harlin Heys FNP.

## 2018-06-18 ENCOUNTER — Ambulatory Visit (INDEPENDENT_AMBULATORY_CARE_PROVIDER_SITE_OTHER): Payer: PRIVATE HEALTH INSURANCE | Admitting: Family Medicine

## 2018-06-18 ENCOUNTER — Encounter: Payer: Self-pay | Admitting: Family Medicine

## 2018-06-18 VITALS — BP 102/60 | HR 85 | Temp 97.8°F | Resp 16 | Ht 65.0 in | Wt 142.8 lb

## 2018-06-18 DIAGNOSIS — F419 Anxiety disorder, unspecified: Secondary | ICD-10-CM | POA: Insufficient documentation

## 2018-06-18 DIAGNOSIS — F902 Attention-deficit hyperactivity disorder, combined type: Secondary | ICD-10-CM | POA: Diagnosis not present

## 2018-06-18 DIAGNOSIS — Z30013 Encounter for initial prescription of injectable contraceptive: Secondary | ICD-10-CM

## 2018-06-18 DIAGNOSIS — Z3042 Encounter for surveillance of injectable contraceptive: Secondary | ICD-10-CM | POA: Diagnosis not present

## 2018-06-18 DIAGNOSIS — F332 Major depressive disorder, recurrent severe without psychotic features: Secondary | ICD-10-CM

## 2018-06-18 DIAGNOSIS — F329 Major depressive disorder, single episode, unspecified: Secondary | ICD-10-CM | POA: Insufficient documentation

## 2018-06-18 LAB — POCT URINE PREGNANCY: PREG TEST UR: NEGATIVE

## 2018-06-18 MED ORDER — ADDERALL XR 20 MG PO CP24: 20.0000 mg | ORAL_CAPSULE | Freq: Every day | ORAL | 0 refills | Status: DC

## 2018-06-18 MED ORDER — PAROXETINE HCL 30 MG PO TABS: 30.0000 mg | ORAL_TABLET | Freq: Every day | ORAL | 3 refills | Status: DC

## 2018-06-18 MED ORDER — MEDROXYPROGESTERONE ACETATE 150 MG/ML IM SUSP
150.0000 mg | Freq: Once | INTRAMUSCULAR | Status: AC
  Administered 2018-06-18: 150 mg via INTRAMUSCULAR

## 2018-06-18 MED ORDER — MEDROXYPROGESTERONE ACETATE 150 MG/ML IM SUSP
150.0000 mg | INTRAMUSCULAR | Status: DC
  Administered 2018-12-18: 150 mg via INTRAMUSCULAR

## 2018-06-18 NOTE — Progress Notes (Signed)
Subjective:    Patient ID: Kathleen Robinson, female    DOB: Apr 30, 1995, 23 y.o.   MRN: [id number]  HPI  This is a 23 yo female being seen today for follow up of depression and ADHD. She mentioned starting back on Depo for birth control as she is in a new relationship. 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 Is interested in establishing psychological care with a local therapist in [place], close to her work.   Past Medical History:  Diagnosis Date  . Depression    No past surgical history on file. Family History  Problem Relation Age of Onset  . Diabetes Other   . Stroke Other   . Depression Mother   . Hyperlipidemia Father    Social History   Tobacco Use  . Smoking status: Never Smoker  . Smokeless tobacco: Never Used  Substance Use Topics  . Alcohol use: Yes    Comment: occassionally   . Drug use: Never     Review of Systems  Constitutional: Negative.   HENT: Negative.   Respiratory: Negative.   Neurological: Negative.    Per HPI    Objective:   Physical Exam Vitals signs reviewed.  Constitutional:      Appearance: Normal appearance. She is normal weight.  HENT:     Head: Normocephalic.  Neurological:     Mental Status: She is alert and oriented to person, place, and time.  Psychiatric:        Attention and Perception: Attention normal.        Mood and Affect: Mood normal.        Behavior: Behavior is cooperative.     Comments: Speech is rapid.    BP 102/60 (BP Location: Right Arm, Patient Position: Sitting)   Pulse 85   Temp 97.8 F (36.6 C) (Oral)   Resp 16   Ht 5\' 5"  (1.651 m)   Wt 142 lb 12 oz (64.8 kg)   SpO2 98%   BMI 23.75 kg/m  BP Readings from Last 3 Encounters:  06/18/18 102/60  02/16/18 122/70  01/19/18 128/90   Wt Readings from Last 3 Encounters:  06/18/18 142 lb 12 oz (64.8 kg)  02/16/18 141 lb 1.9 oz (64 kg)  01/19/18 140 lb (63.5 kg)   Results for orders placed  or performed in visit on 06/18/18  POCT urine pregnancy  Result Value Ref Range   Preg Test, Ur Negative Negative   Depression screen [facility] 2/9 06/18/2018 02/16/2018  Decreased Interest 1 0  Down, Depressed, Hopeless 2 2  PHQ - 2 Score 3 2  Altered sleeping 0 1  Tired, decreased energy 2 1  Change in appetite 0 1  Feeling bad or failure about yourself  3 3  Trouble concentrating 1 1  Moving slowly or fidgety/restless 0 0  Suicidal thoughts 1 0  PHQ-9 Score 10 9  Difficult doing work/chores Very difficult -   GAD 7 : Generalized Anxiety Score 06/18/2018  Nervous, Anxious, on Edge 1  Control/stop worrying 0  Worry too much - different things 1  Trouble relaxing 0  Restless 0  Easily annoyed or irritable 2  Afraid - awful might happen 0  Total GAD 7 Score 4  Anxiety Difficulty Very difficult      Assessment & Plan:  Initiation of Depo Provera - Plan: POCT urine pregnancy, medroxyPROGESTERone (DEPO-PROVERA) injection 150 mg  Severe recurrent major depression without psychotic features (  HCC) - Plan: PARoxetine (PAXIL) 30 MG tablet, DISCONTINUED: PARoxetine (PAXIL) 30 MG tablet  Attention deficit hyperactivity disorder (ADHD), combined type - Plan: ADDERALL XR 20 MG 24 hr capsule, DISCONTINUED: ADDERALL XR 20 MG 24 hr capsule  Patient Instructions  Good to see you today  Please follow up in 2 months, sooner if your symptoms worsen  Let me know when you need a refill of your Adderall- give me at least 3 days to refill. You can call or sign in to your Mychart account  Schedule an appointment with a therapist

## 2018-06-18 NOTE — Patient Instructions (Signed)
Good to see you today  Please follow up in 2 months, sooner if your symptoms worsen  Let me know when you need a refill of your Adderall- give me at least 3 days to refill. You can call or sign in to your Mychart account  Schedule an appointment with a therapist

## 2018-06-18 NOTE — Progress Notes (Signed)
Subjective:    Patient ID: Kathleen Robinson, female    DOB: 04-Jun-1995, 23 y.o.   MRN: [id number]  HPI This is a 23 yo female who presents today for follow up of ADHD, anxiety/depression. She called the office 06/15/18 to report that she was out of medication but could not get it filled over the weekend. Since I saw her in the fall, she has started a new job selling cars. She has enjoyed it, but it is very stressful. Less so as she is doing it longer. She got a kitten which has brought her enjoyment.   ADHD- Was taking Adderall XR 20 mg. Improved focus and concentration, takes on work days and some days off depending on what she needs to get done. Records from [facility] were not previously visible on Care Everywhere, but are today. She has adderall prescriptions documented as far back as 2012.   Anxiety/depression- High stress level with car sales. She did not establish with a new therapist. She is in a new relationship. Thinking about going to her boyfriend's therapist. Sleeping well. Felt that her symptoms were well controlled prior to running out of her paroxetine 30 mg.   Sexually active. Would like to go back on depo provera. Used in past with good results and no bothersome side effects. Last period ended 5 days ago.   Past Medical History:  Diagnosis Date  . Depression    No past surgical history on file. Family History  Problem Relation Age of Onset  . Diabetes Other   . Stroke Other   . Depression Mother   . Hyperlipidemia Father    Social History   Tobacco Use  . Smoking status: Never Smoker  . Smokeless tobacco: Never Used  Substance Use Topics  . Alcohol use: Yes    Comment: occassionally   . Drug use: Never      Review of Systems Per HPI    Objective:   Physical Exam Vitals signs reviewed.  Constitutional:      General: She is not in acute distress.    Appearance: Normal appearance. She is not ill-appearing, toxic-appearing or diaphoretic.  HENT:   Head: Normocephalic and atraumatic.  Eyes:     Conjunctiva/sclera: Conjunctivae normal.  Cardiovascular:     Rate and Rhythm: Normal rate.  Pulmonary:     Effort: Pulmonary effort is normal.  Neurological:     Mental Status: She is alert and oriented to person, place, and time.  Psychiatric:        Attention and Perception: Attention normal.        Mood and Affect: Mood is anxious (mildly).        Speech: Speech is tangential.        Behavior: Behavior normal.        Thought Content: Thought content normal.     Comments: Speech is rapid       BP 102/60 (BP Location: Right Arm, Patient Position: Sitting)   Pulse 85   Temp 97.8 F (36.6 C) (Oral)   Resp 16   Ht 5\' 5"  (1.651 m)   Wt 142 lb 12 oz (64.8 kg)   SpO2 98%   BMI 23.75 kg/m  Wt Readings from Last 3 Encounters:  06/18/18 142 lb 12 oz (64.8 kg)  02/16/18 141 lb 1.9 oz (64 kg)  01/19/18 140 lb (63.5 kg)   GAD 7 : Generalized Anxiety Score 06/18/2018  Nervous, Anxious, on Edge 1  Control/stop worrying 0  Worry too  much - different things 1  Trouble relaxing 0  Restless 0  Easily annoyed or irritable 2  Afraid - awful might happen 0  Total GAD 7 Score 4  Anxiety Difficulty Very difficult    Depression screen [facility] 2/9 06/18/2018 02/16/2018  Decreased Interest 1 0  Down, Depressed, Hopeless 2 2  PHQ - 2 Score 3 2  Altered sleeping 0 1  Tired, decreased energy 2 1  Change in appetite 0 1  Feeling bad or failure about yourself  3 3  Trouble concentrating 1 1  Moving slowly or fidgety/restless 0 0  Suicidal thoughts 1 0  PHQ-9 Score 10 9  Difficult doing work/chores Very difficult -       Assessment & Plan:  1. Initiation of Depo Provera - discussed potential side effects with patient - POCT urine pregnancy - medroxyPROGESTERone (DEPO-PROVERA) injection 150 mg  2. Severe recurrent major depression without psychotic features (HCC) - she felt that she was doing well on paroxetine, she wishes to resume the  paroxetine  - PARoxetine (PAXIL) 30 MG tablet; Take 1 tablet (30 mg total) by mouth daily.  Dispense: 90 tablet; Refill: 3  3. Attention deficit hyperactivity disorder (ADHD), combined type - she will let me know when she needs a refill - ADDERALL XR 20 MG 24 hr capsule; Take 1 capsule (20 mg total) by mouth daily.  Dispense: 30 capsule; Refill: 0  4. Depot contraception - reviewed risks and potential side effects, patient agrees to importance of calcium rich diet and weight bearing exercise - medroxyPROGESTERone (DEPO-PROVERA) injection 150 mg   [name], FNP-BC  [facility] at [facility], [facility]  06/20/2018 8:50 AM

## 2018-06-21 ENCOUNTER — Encounter: Payer: Self-pay | Admitting: Family Medicine

## 2018-08-15 ENCOUNTER — Encounter: Payer: Self-pay | Admitting: Family Medicine

## 2018-08-15 ENCOUNTER — Ambulatory Visit (INDEPENDENT_AMBULATORY_CARE_PROVIDER_SITE_OTHER): Payer: PRIVATE HEALTH INSURANCE | Admitting: Family Medicine

## 2018-08-15 VITALS — HR 120 | Ht 65.0 in

## 2018-08-15 DIAGNOSIS — F902 Attention-deficit hyperactivity disorder, combined type: Secondary | ICD-10-CM

## 2018-08-15 DIAGNOSIS — F419 Anxiety disorder, unspecified: Secondary | ICD-10-CM | POA: Diagnosis not present

## 2018-08-15 DIAGNOSIS — Z20828 Contact with and (suspected) exposure to other viral communicable diseases: Secondary | ICD-10-CM | POA: Diagnosis not present

## 2018-08-15 DIAGNOSIS — F329 Major depressive disorder, single episode, unspecified: Secondary | ICD-10-CM | POA: Diagnosis not present

## 2018-08-15 MED ORDER — AMPHETAMINE-DEXTROAMPHET ER 20 MG PO CP24: 20.0000 mg | ORAL_CAPSULE | Freq: Every day | ORAL | 0 refills | Status: DC

## 2018-08-15 NOTE — Patient Instructions (Signed)
Hi Kindle, Good to see you for your virtual visit today.  Glad your depression and anxiety have improved. Have included some helpful advice for coping with anxiety during these trying times below. Please follow-up in 3-4 months. Let me know when you need a refill of your Adderall. Stay well, Deboraha Sprang, FNP-BC  How to help anxiety - without medication.   1) Regular Exercise - walking, jogging, cycling, dancing, strength training  2)  Begin a Mindfulness/Meditation practice -- this can take a little as 3 minutes and is helpful for all kinds of mood issues -- You can find resources in books -- Or you can download apps like  ---- Headspace App (which currently has free content called "Weathering the Storm") ---- Calm (which has a few free options)  ---- Insignt Timer ---- Stop, Breathe & Think  # With each of these Apps - you should decline the "start free trial" offer and as you search through the App should be able to access some of their free content. You can also chose to pay for the content if you find one that works well for you.   # Many of them also offer sleep specific content which may help with insomnia  3) Healthy Diet -- Avoid or decrease Caffeine -- Avoid or decrease Alcohol -- Drink plenty of water, have a balanced diet -- Avoid cigarettes and marijuana (as well as other recreational drugs)  4) Consider contacting a professional therapist

## 2018-08-15 NOTE — Progress Notes (Signed)
Virtual Visit via Video Note  I connected with Kathleen Robinson on 08/15/18 at  8:00 AM EDT by a video enabled telemedicine application and verified that I am speaking with the correct person using two identifiers.  She was in her home and I was in my office.   I discussed the limitations of evaluation and management by telemedicine and the availability of in person appointments. The patient expressed understanding and agreed to proceed.  History of Present Illness: This is a 23 yo female who presents for virtual visit today for 2 month follow up of depression/anxiety.  She was seen 2 months ago and restarted on her Paxil.  She reports that she is doing significantly better with getting back on her medication.  She is continuing to work in [employer].  This is caused her a little stressed being around people during the corona pandemic.  She is wearing a mask and washing her hands frequently.  During this time her boyfriend has been staying with her and this has helped with her anxiety and depression.  She continues to use Adderall for her ADHD while working.  She is also concerned for recent exposure to influenza. Her boyfriend was diagnosed with influenza A yesterday. He is has been symptomatic for awhile and is taking Tamiflu.  She has had a dry cough and mild headache for about a week.  No nasal drainage sneezing or watery eyes.  No myalgias.  No shortness of breath.  No fever.   Observations/Objective:  Pulse (!) 120   Ht 5\' 5"  (1.651 m)   LMP 08/14/2018 Comment: has spotting, she is on Depo injections  BMI 23.75 kg/m  Depression screen [facility] 2/9 08/15/2018 06/18/2018 02/16/2018  Decreased Interest 0 1 0  Down, Depressed, Hopeless 1 2 2   PHQ - 2 Score 1 3 2   Altered sleeping 1 0 1  Tired, decreased energy 2 2 1   Change in appetite 0 0 1  Feeling bad or failure about yourself  1 3 3   Trouble concentrating 0 1 1  Moving slowly or fidgety/restless 0 0 0  Suicidal thoughts 0 1 0  PHQ-9 Score 5  10 9   Difficult doing work/chores Not difficult at all Very difficult -   GAD 7 : Generalized Anxiety Score 08/15/2018 06/18/2018  Nervous, Anxious, on Edge 0 1  Control/stop worrying 0 0  Worry too much - different things 1 1  Trouble relaxing 0 0  Restless 0 0  Easily annoyed or irritable 1 2  Afraid - awful might happen 0 0  Total GAD 7 Score 2 4  Anxiety Difficulty Not difficult at all Very difficult     Assessment and Plan: 1. Anxiety and depression -Significantly improved back on her medication will continue paroxetine 30 mg daily -Follow-up in 3 to 4 months, sooner if worsening symptoms  2. Attention deficit hyperactivity disorder (ADHD), combined type - amphetamine-dextroamphetamine (ADDERALL XR) 20 MG 24 hr capsule; Take 1 capsule (20 mg total) by mouth daily.  Dispense: 30 capsule; Refill: 0 -Follow-up in 3 months, she can let me know if she needs a refill sooner  3. Exposure to influenza -Discussed pros and cons of preventive Tamiflu and patient decided against taking -She will continue to monitor for symptoms and treat symptomatically   [name], FNP-BC  [facility] at [facility], [facility]  08/15/2018 9:04 AM   Follow Up Instructions: A recap of our visit and patient instructions were mailed to patient.  I discussed the assessment and treatment plan with the patient. The patient was provided an opportunity to ask questions and all were answered. The patient agreed with the plan and demonstrated an understanding of the instructions.   The patient was advised to call back or seek an in-person evaluation if the symptoms worsen or if the condition fails to improve as anticipated.   Elby Beck, FNP

## 2018-08-16 ENCOUNTER — Telehealth: Payer: Self-pay

## 2018-08-16 MED ORDER — OSELTAMIVIR PHOSPHATE 75 MG PO CAPS: 75.0000 mg | ORAL_CAPSULE | Freq: Two times a day (BID) | ORAL | 0 refills | Status: DC

## 2018-08-16 NOTE — Telephone Encounter (Signed)
I sent in tamiflu to take twice daily for 5 days  This is likely the flu given her exposure but we of course cannot also rule out Covid  Do drink fluids/rest  Tylenol as needed for fever/body aches (if she can get a thermometer to monitor that would be optimal)  Delsym otc may help cough  If suddenly worse or short of breath go to the ER Keep [place] updated I will forward to [facility]

## 2018-08-16 NOTE — Telephone Encounter (Signed)
Spoke with patient, gave verbal understanding of instructions per Dr. Milinda Antis.

## 2018-08-16 NOTE — Telephone Encounter (Signed)
Please review for [name], NP if possible

## 2018-08-16 NOTE — Telephone Encounter (Signed)
Pt had virtual visit on 08/15/18. Today pt has body aches, H/A, non prod cough,scratchy throat,feels warm but does not have a thermometer; pt states feel like a truck has hit her. No SOB or chills. pts boyfriend tested positive on 08/14/18 for flu A and he has been with pt. Pt said D Leone Payor FNP advised if developed flu like symptoms to call office and let her know. [facility].Please advise.

## 2018-08-17 ENCOUNTER — Emergency Department
Admission: EM | Admit: 2018-08-17 | Discharge: 2018-08-18 | Disposition: A | Discharge status: Home / Self Care | Payer: PRIVATE HEALTH INSURANCE | Attending: Emergency Medicine | Admitting: Emergency Medicine

## 2018-08-17 ENCOUNTER — Other Ambulatory Visit: Payer: Self-pay

## 2018-08-17 ENCOUNTER — Emergency Department: Payer: PRIVATE HEALTH INSURANCE

## 2018-08-17 DIAGNOSIS — R0789 Other chest pain: Secondary | ICD-10-CM | POA: Insufficient documentation

## 2018-08-17 DIAGNOSIS — Z79899 Other long term (current) drug therapy: Secondary | ICD-10-CM | POA: Insufficient documentation

## 2018-08-17 DIAGNOSIS — R079 Chest pain, unspecified: Secondary | ICD-10-CM

## 2018-08-17 LAB — CBC WITH DIFFERENTIAL/PLATELET
Abs Immature Granulocytes: 0.03 10*3/uL (ref 0.00–0.07)
Basophils Absolute: 0.1 10*3/uL (ref 0.0–0.1)
Basophils Relative: 1 %
Eosinophils Absolute: 0.2 10*3/uL (ref 0.0–0.5)
Eosinophils Relative: 2 %
HCT: 37.1 % (ref 36.0–46.0)
Hemoglobin: 12.3 g/dL (ref 12.0–15.0)
Immature Granulocytes: 0 %
Lymphocytes Relative: 33 %
Lymphs Abs: 2.4 10*3/uL (ref 0.7–4.0)
MCH: 27.6 pg (ref 26.0–34.0)
MCHC: 33.2 g/dL (ref 30.0–36.0)
MCV: 83.2 fL (ref 80.0–100.0)
Monocytes Absolute: 0.5 10*3/uL (ref 0.1–1.0)
Monocytes Relative: 7 %
Neutro Abs: 4.2 10*3/uL (ref 1.7–7.7)
Neutrophils Relative %: 57 %
Platelets: 268 10*3/uL (ref 150–400)
RBC: 4.46 MIL/uL (ref 3.87–5.11)
RDW: 13 % (ref 11.5–15.5)
WBC: 7.4 10*3/uL (ref 4.0–10.5)
nRBC: 0 % (ref 0.0–0.2)

## 2018-08-17 LAB — POCT PREGNANCY, URINE: Preg Test, Ur: NEGATIVE

## 2018-08-17 MED ORDER — KETOROLAC TROMETHAMINE 30 MG/ML IJ SOLN
15.0000 mg | Freq: Once | INTRAMUSCULAR | Status: AC
  Administered 2018-08-17: 15 mg via INTRAVENOUS
  Filled 2018-08-17: qty 1

## 2018-08-17 NOTE — ED Provider Notes (Signed)
[facility] Emergency Department Provider Note   ____________________________________________   First MD Initiated Contact with Patient 08/17/18 2308     (approximate)  I have reviewed the triage vital signs and the nursing notes.   HISTORY  Chief Complaint Chest Pain    HPI Kathleen Robinson is a 23 y.o. female who presents to the ED from home with a chief complaint of chest pain.  Patient's boyfriend tested positive for flu a earlier this week.  Shortly thereafter, patient started to experience symptoms of subjective fever and myalgias.  Complains of left breast pain which started tonight.  Describes sharp pain stabbing her left breast, migrating to her right breast, and migrating to her left back.  Pain is not exacerbated with deep breathing but instead with movement.  Denies cough, shortness of breath, abdominal pain, nausea or vomiting.  Denies recent travel, trauma or exposure to persons diagnosed with coronavirus.  Patient does take the Depo shot.        Past Medical History:  Diagnosis Date   Depression     Patient Active Problem List   Diagnosis Date Noted   Anxiety and depression 06/18/2018   Attention deficit hyperactivity disorder (ADHD) 01/23/2018   Severe recurrent major depression without psychotic features (HCC) 01/19/2018   Suicide attempt (HCC) 01/19/2018    No past surgical history on file.  Prior to Admission medications   Medication Sig Start Date End Date Taking? Authorizing Provider  amphetamine-dextroamphetamine (ADDERALL XR) 20 MG 24 hr capsule Take 1 capsule (20 mg total) by mouth daily. 08/15/18   Emi Belfast, FNP  oseltamivir (TAMIFLU) 75 MG capsule Take 1 capsule (75 mg total) by mouth 2 (two) times daily. 08/16/18   Tower, Audrie Gallus, MD  PARoxetine (PAXIL) 30 MG tablet Take 1 tablet (30 mg total) by mouth daily. 06/18/18   Emi Belfast, FNP    Allergies Wasp venom  Family History  Problem Relation Age  of Onset   Diabetes Other    Stroke Other    Depression Mother    Hyperlipidemia Father     Social History Social History   Tobacco Use   Smoking status: Never Smoker   Smokeless tobacco: Never Used  Substance Use Topics   Alcohol use: Yes    Comment: occassionally    Drug use: Never    Review of Systems  Constitutional: No fever/chills Eyes: No visual changes. ENT: No sore throat. Cardiovascular: Positive for chest pain. Respiratory: Denies shortness of breath. Gastrointestinal: No abdominal pain.  No nausea, no vomiting.  No diarrhea.  No constipation. Genitourinary: Negative for dysuria. Musculoskeletal: Negative for back pain. Skin: Negative for rash. Neurological: Negative for headaches, focal weakness or numbness.   ____________________________________________   PHYSICAL EXAM:  VITAL SIGNS: ED Triage Vitals  Enc Vitals Group     BP 08/17/18 2156 (!) 133/99     Pulse Rate 08/17/18 2156 85     Resp 08/17/18 2156 18     Temp 08/17/18 2156 98.3 F (36.8 C)     Temp Source 08/17/18 2156 Oral     SpO2 08/17/18 2156 97 %     Weight 08/17/18 2157 145 lb (65.8 kg)     Height --      Head Circumference --      Peak Flow --      Pain Score 08/17/18 2156 3     Pain Loc --      Pain Edu? --  Excl. in GC? --     Constitutional: Alert and oriented. Well appearing and in no acute distress.  Pleasant and talkative. Eyes: Conjunctivae are normal. PERRL. EOMI. Head: Atraumatic. Nose: No congestion/rhinnorhea. Mouth/Throat: Mucous membranes are moist.  Oropharynx non-erythematous. Neck: No stridor.   Cardiovascular: Normal rate, regular rhythm. Grossly normal heart sounds.  Good peripheral circulation. Respiratory: Normal respiratory effort.  No retractions. Lungs CTAB.  Left lower chest wall tender to palpation. Gastrointestinal: Soft and nontender. No distention. No abdominal bruits. No CVA tenderness. Musculoskeletal: No lower extremity tenderness  nor edema.  No joint effusions. Neurologic:  Normal speech and language. No gross focal neurologic deficits are appreciated. No gait instability. Skin:  Skin is warm, dry and intact. No rash noted.  No vesicles. Psychiatric: Mood and affect are normal. Speech and behavior are normal.  ____________________________________________   LABS (all labs ordered are listed, but only abnormal results are displayed)  Labs Reviewed  COMPREHENSIVE METABOLIC PANEL - Abnormal; Notable for the following components:      Result Value   Creatinine, Ser 1.05 (*)    Calcium 8.8 (*)    All other components within normal limits  CBC WITH DIFFERENTIAL/PLATELET  TROPONIN I  FIBRIN DERIVATIVES D-DIMER ([facility] ONLY)  POC URINE PREG, ED  POCT PREGNANCY, URINE   ____________________________________________  EKG  ED ECG REPORT I, Adyn Serna J, the attending physician, personally viewed and interpreted this ECG.   Date: 08/17/2018  EKG Time: 2202  Rate: 98  Rhythm: normal EKG, normal sinus rhythm  Axis: Normal  Intervals:none  ST&T Change: Nonspecific  ____________________________________________  RADIOLOGY  ED MD interpretation: No acute cardiopulmonary process  Official radiology report(s): Dg Chest Portable 1 View  Result Date: 08/17/2018 CLINICAL DATA:  Chest pain EXAM: PORTABLE CHEST 1 VIEW COMPARISON:  None. FINDINGS: The heart size and mediastinal contours are within normal limits. Both lungs are clear. The visualized skeletal structures are unremarkable. IMPRESSION: No active disease. Electronically Signed   By: Jasmine PangKim  Fujinaga M.D.   On: 08/17/2018 23:02    ____________________________________________   PROCEDURES  Procedure(s) performed (including Critical Care):  Procedures   ____________________________________________   INITIAL IMPRESSION / ASSESSMENT AND PLAN / ED COURSE  As part of my medical decision making, I reviewed the following data within the electronic medical  record:  Nursing notes reviewed and incorporated, Labs reviewed, EKG interpreted, Old chart reviewed, Radiograph reviewed and Notes from prior ED visits     Kathleen Robinson was evaluated in Emergency Department on 08/18/2018 for the symptoms described in the history of present illness. She was evaluated in the context of the global COVID-19 pandemic, which necessitated consideration that the patient might be at risk for infection with the SARS-CoV-2 virus that causes COVID-19. Institutional protocols and algorithms that pertain to the evaluation of patients at risk for COVID-19 are in a state of rapid change based on information released by regulatory bodies including the CDC and federal and state organizations. These policies and algorithms were followed during the patient's care in the ED.   23 year old otherwise healthy female who presents with chest pain in the setting of likely flu. Differential diagnosis includes, but is not limited to, ACS, aortic dissection, pulmonary embolism, cardiac tamponade, pneumothorax, pneumonia, pericarditis, myocarditis, GI-related causes including esophagitis/gastritis, and musculoskeletal chest wall pain.    Chest x-ray negative for pneumonia.  Will obtain lab work including d-dimer, administer IV Toradol for pain and reassess.  Clinical Course as of Aug 18 23  Sat Aug 18, 2018  0023 Patient resting in no acute distress.  Updated her all test results.  Strict return precautions given.  Patient verbalizes understanding agrees with plan of care.   [JS]    Clinical Course User Index [JS] Irean Hong, MD     ____________________________________________   FINAL CLINICAL IMPRESSION(S) / ED DIAGNOSES  Final diagnoses:  Nonspecific chest pain     ED Discharge Orders    None       Note:  This document was prepared using Dragon voice recognition software and may include unintentional dictation errors.   Irean Hong, MD 08/18/18 0130

## 2018-08-17 NOTE — ED Notes (Signed)
Pt's boyfriend tested postive for flu A and shortly after, pt started to experience symptoms. Pt's chest pain started today and was more noticeable as the evening went on but now rates pain as a 1/10.

## 2018-08-17 NOTE — ED Triage Notes (Signed)
Patient reports having chest pain at left breast off/on since 3 pm, more consistent over past hour.

## 2018-08-17 NOTE — Telephone Encounter (Signed)
Called to check on patient and her symptoms. She reports that she feels better. Started Tamiflu. Some scratchy throat. No cough. Felt hot yesterday, not today. Has not had any respiratory symptoms. Will discontinue telephone follow up at this time. Patient was instructed to notify office if symptoms return or new symptoms occur.

## 2018-08-18 LAB — COMPREHENSIVE METABOLIC PANEL
ALT: 8 U/L (ref 0–44)
AST: 16 U/L (ref 15–41)
Albumin: 4.1 g/dL (ref 3.5–5.0)
Alkaline Phosphatase: 45 U/L (ref 38–126)
Anion gap: 8 (ref 5–15)
BUN: 17 mg/dL (ref 6–20)
CO2: 23 mmol/L (ref 22–32)
Calcium: 8.8 mg/dL — ABNORMAL LOW (ref 8.9–10.3)
Chloride: 107 mmol/L (ref 98–111)
Creatinine, Ser: 1.05 mg/dL — ABNORMAL HIGH (ref 0.44–1.00)
GFR calc Af Amer: >60 mL/min (ref >60)
GFR calc non Af Amer: >60 mL/min (ref >60)
Glucose, Bld: 99 mg/dL (ref 70–99)
Potassium: 3.7 mmol/L (ref 3.5–5.1)
Sodium: 138 mmol/L (ref 135–145)
Total Bilirubin: 0.9 mg/dL (ref 0.3–1.2)
Total Protein: 7.1 g/dL (ref 6.5–8.1)

## 2018-08-18 LAB — FIBRIN DERIVATIVES D-DIMER (ARMC ONLY): Fibrin derivatives D-dimer (ARMC): 231.48 ng/mL (FEU) (ref 0.00–499.00)

## 2018-08-18 LAB — TROPONIN I: Troponin I: <0.03 ng/mL (ref <0.03)

## 2018-08-18 NOTE — Discharge Instructions (Addendum)
1.  You may take Tylenol and/or Ibuprofen as needed for discomfort. °2.  Apply moist heat to affected area several times daily. °3.  Return to the ER for worsening symptoms, persistent vomiting, difficulty breathing or other concerns. °

## 2018-08-20 ENCOUNTER — Telehealth: Payer: Self-pay

## 2018-08-20 NOTE — Telephone Encounter (Signed)
Per chart review tab pt was seen [facility] ED on 08/17/18.

## 2018-08-20 NOTE — Telephone Encounter (Signed)
Noted  

## 2018-08-20 NOTE — Telephone Encounter (Signed)
[facility] Night - Client TELEPHONE ADVICE RECORD [facility] Medical Call Center Patient Name: Kathleen Robinson Gender: Female DOB: 1996/01/18 Age: 23 Y 10 D Return Phone Number: [phone] (Primary) Address: City/State/Zip: [place] [address] Client [facility] Night - Client Client Site [facility] - Night Physician Deboraha Sprang- NP Contact Type Call Who Is Calling Patient / Member / Family / Caregiver Call Type Triage / Clinical Relationship To Patient Self Return Phone Number [phone] (Primary) Chief Complaint CHEST PAIN (>=21 years) - pain, pressure, heaviness or tightness Reason for Call Symptomatic / Request for Health Information Initial Comment Caller states was told to call this number if she is having new sxs w her flu. Pt is having chest pain. no fever all day until 5P. pt wasn't personally tested for flu but bf had it. GOTO Facility Not Listed [facility] [employer] [facility] No Nurse Assessment Nurse: Laural Benes, RN, Adelina Mings Date/Time (Eastern Time): 08/17/2018 8:56:39 PM Confirm and document reason for call. If symptomatic, describe symptoms. ---Caller states boyfriend had flu and has talked with Dr and presumed she has flu. Having chest pain this evening, fever this evening and body aches. Feels like mucous in throat. Is on tamiflu. Has the patient had close contact with a person known or suspected to have the novel coronavirus illness OR traveled / lives in area with major community spread (including international travel) in the last 14 days from the onset of symptoms? * If Asymptomatic, screen for exposure and travel within the last 14 days. ---Yes Does the patient have any new or worsening symptoms? ---Yes Will a triage be completed? ---Yes Related visit to physician within the last 2 weeks? ---Yes Does the PT have any chronic conditions? (i.e. diabetes, asthma, this includes High  risk factors for pregnancy, etc.) ---No Is the patient pregnant or possibly pregnant? (Ask all females between the ages of 1-55) ---No Is this a behavioral health or substance abuse call? ---No PLEASE NOTE: All timestamps contained within this report are represented as [place] Standard Time. CONFIDENTIALTY NOTICE: This fax transmission is intended only for the addressee. It contains information that is legally privileged, confidential or otherwise protected from use or disclosure. If you are not the intended recipient, you are strictly prohibited from reviewing, disclosing, copying using or disseminating any of this information or taking any action in reliance on or regarding this information. If you have received this fax in error, please notify us immediately by telephone so that we can arrange for its return to [place]. Phone: [phone], Toll-Free: [phone], Fax: [phone] Page: 2 of 2 Call Id: [id number] Guidelines Guideline Title Affirmed Question Affirmed Notes Nurse Date/Time Lamount Cohen Time) Influenza - Seasonal Chest pain (Exception: MILD central chest pain, present only when coughing) Laural Benes, RN, Adelina Mings 08/17/2018 9:00:07 PM Disp. Time Lamount Cohen Time) Disposition Final User 08/17/2018 8:53:28 PM Send to Urgent Mack Guise 08/17/2018 9:05:42 PM Go to ED Now Yes Laural Benes, RN, Margaree Mackintosh Disagree/Comply Comply Caller Understands Yes PreDisposition InappropriateToAsk Care Advice Given Per Guideline GO TO ED NOW: * You need to be seen in the Emergency Department. * Go to the ED at ___________ Hospital. * Leave now. Drive carefully. DRIVING: Another adult should drive. CARE ADVICE given per INFLUENZA - SEASONAL (Adult) guideline. Referrals GO TO FACILITY OTHER - SPECIFY

## 2018-09-19 ENCOUNTER — Other Ambulatory Visit: Payer: Self-pay

## 2018-09-19 ENCOUNTER — Ambulatory Visit (INDEPENDENT_AMBULATORY_CARE_PROVIDER_SITE_OTHER): Payer: PRIVATE HEALTH INSURANCE

## 2018-09-19 DIAGNOSIS — Z308 Encounter for other contraceptive management: Secondary | ICD-10-CM

## 2018-09-19 LAB — POCT URINE PREGNANCY: Preg Test, Ur: NEGATIVE

## 2018-09-19 NOTE — Progress Notes (Signed)
Pt was outside her Depo-Provera window (5/12- 5/26).  Last inj, 06/18/18.   Will repeat urine pregnancy test in 1 wk,  09/26/18. If neg, will administer Depo-Provera.

## 2018-09-26 ENCOUNTER — Other Ambulatory Visit: Payer: Self-pay

## 2018-09-26 ENCOUNTER — Ambulatory Visit (INDEPENDENT_AMBULATORY_CARE_PROVIDER_SITE_OTHER): Payer: PRIVATE HEALTH INSURANCE

## 2018-09-26 DIAGNOSIS — Z308 Encounter for other contraceptive management: Secondary | ICD-10-CM | POA: Diagnosis not present

## 2018-09-26 LAB — POCT URINE PREGNANCY: Preg Test, Ur: NEGATIVE

## 2018-09-26 MED ORDER — MEDROXYPROGESTERONE ACETATE 150 MG/ML IM SUSP
150.0000 mg | Freq: Once | INTRAMUSCULAR | Status: AC
  Administered 2018-09-26: 10:00:00 150 mg via INTRAMUSCULAR

## 2018-09-26 NOTE — Progress Notes (Signed)
Patient in today for depo provera injection.  She was out of her window for routine injection for pregnancy prevention and pregnancy test performed.   She had initial negative pregnancy test on 09/19/18 and returned today 1 week later for repeat with confirmation of negative result.   Per orders of Deboraha Sprang, NP , injection of Depo Provera given by Kizzie Ide RN.  Administered deep IM to left dorsal gluteal due to size of patient, determined best site.   Patient tolerated injection well.

## 2018-10-08 ENCOUNTER — Other Ambulatory Visit: Payer: Self-pay | Admitting: Family Medicine

## 2018-10-08 ENCOUNTER — Telehealth: Payer: Self-pay | Admitting: Family Medicine

## 2018-10-08 DIAGNOSIS — F902 Attention-deficit hyperactivity disorder, combined type: Secondary | ICD-10-CM

## 2018-10-08 MED ORDER — AMPHETAMINE-DEXTROAMPHET ER 20 MG PO CP24: 20.0000 mg | ORAL_CAPSULE | Freq: Every day | ORAL | 0 refills | Status: DC

## 2018-10-08 NOTE — Telephone Encounter (Signed)
Please advise 

## 2018-10-08 NOTE — Telephone Encounter (Signed)
Called call patient.  No answer.  Left voicemail to let her know that I have refilled her prescription.  Okay per DPR.

## 2018-10-08 NOTE — Telephone Encounter (Signed)
Best number [phone] Pt called needing to get refill on adderall [facility] [place] Pt is out of meds Please advise when this has been called in [facility] stated [facility] to [facility] office

## 2018-11-13 ENCOUNTER — Other Ambulatory Visit: Payer: Self-pay | Admitting: Family Medicine

## 2018-11-13 DIAGNOSIS — F902 Attention-deficit hyperactivity disorder, combined type: Secondary | ICD-10-CM

## 2018-11-13 MED ORDER — AMPHETAMINE-DEXTROAMPHET ER 20 MG PO CP24: 20.0000 mg | ORAL_CAPSULE | Freq: Every day | ORAL | 0 refills | Status: DC

## 2018-11-13 NOTE — Telephone Encounter (Signed)
Name of Medication:Adderall XR 20 mg  Name of Pharmacy:[facility] or Written Date and Quantity: # 30 on 10/08/18 Last Office Visit and Type:08/15/18 depression,fatigue and ADHD  Next Office Visit and Type: none scheduled   Pt's window of time for Depo Provera injection is 12/12/18 - 12/26/18. When calls pt that adderal xr has been sent in please reschedule Depo provera for pt.

## 2018-11-13 NOTE — Telephone Encounter (Signed)
Patient stated that she recently moved to [place].  She is needing her ADDERALL sent to a new pharmacy [facility] [place]   Patient also stated that she would like to move her Depo Shot to 8/27 but she would like to make sure that this is okay and will keep her in the time frame that she needs it done   C/B # [phone]

## 2018-11-13 NOTE — Telephone Encounter (Signed)
Left message for patient to call back to reschedule and be advised

## 2018-11-19 NOTE — Telephone Encounter (Signed)
Patient advised and appt rescheduled

## 2018-12-18 ENCOUNTER — Other Ambulatory Visit: Payer: Self-pay

## 2018-12-18 ENCOUNTER — Ambulatory Visit (INDEPENDENT_AMBULATORY_CARE_PROVIDER_SITE_OTHER): Payer: PRIVATE HEALTH INSURANCE

## 2018-12-18 DIAGNOSIS — Z308 Encounter for other contraceptive management: Secondary | ICD-10-CM

## 2018-12-18 DIAGNOSIS — Z3042 Encounter for surveillance of injectable contraceptive: Secondary | ICD-10-CM

## 2018-12-18 DIAGNOSIS — F332 Major depressive disorder, recurrent severe without psychotic features: Secondary | ICD-10-CM

## 2018-12-18 DIAGNOSIS — F902 Attention-deficit hyperactivity disorder, combined type: Secondary | ICD-10-CM

## 2018-12-18 NOTE — Telephone Encounter (Signed)
Appointment made for follow up on 12/19/2018. Needs medications sent to [facility]. Not [facility].

## 2018-12-18 NOTE — Progress Notes (Signed)
Per orders of Clarene Reamer, NP (co signed by Dr Glori Bickers in her absence) injection of Depo 150 mg given by Kris Mouton. Patient tolerated injection well.  Next Depo due : 03/05/2019-03/19/2019 dates. Patient aware.

## 2018-12-19 ENCOUNTER — Encounter: Payer: Self-pay | Admitting: Family Medicine

## 2018-12-19 ENCOUNTER — Ambulatory Visit: Payer: PRIVATE HEALTH INSURANCE

## 2018-12-19 ENCOUNTER — Ambulatory Visit (INDEPENDENT_AMBULATORY_CARE_PROVIDER_SITE_OTHER): Payer: PRIVATE HEALTH INSURANCE | Admitting: Family Medicine

## 2018-12-19 VITALS — Ht 65.0 in | Wt 140.0 lb

## 2018-12-19 DIAGNOSIS — F419 Anxiety disorder, unspecified: Secondary | ICD-10-CM

## 2018-12-19 DIAGNOSIS — F329 Major depressive disorder, single episode, unspecified: Secondary | ICD-10-CM | POA: Diagnosis not present

## 2018-12-19 DIAGNOSIS — F902 Attention-deficit hyperactivity disorder, combined type: Secondary | ICD-10-CM | POA: Diagnosis not present

## 2018-12-19 MED ORDER — PAROXETINE HCL 30 MG PO TABS: 30.0000 mg | ORAL_TABLET | Freq: Every day | ORAL | 3 refills | Status: DC

## 2018-12-19 MED ORDER — AMPHETAMINE-DEXTROAMPHET ER 20 MG PO CP24: 20.0000 mg | ORAL_CAPSULE | ORAL | 0 refills | Status: DC

## 2018-12-19 MED ORDER — AMPHETAMINE-DEXTROAMPHET ER 20 MG PO CP24: 20.0000 mg | ORAL_CAPSULE | Freq: Every day | ORAL | 0 refills | Status: DC

## 2018-12-19 NOTE — Progress Notes (Signed)
Virtual Visit via Video Note  I connected with Monserrath Junio on 12/19/18 at 11:45 AM EDT by a video enabled telemedicine application and verified that I am speaking with the correct person using two identifiers.  Location: Patient: In her office at work Provider: [facility]   I discussed the limitations of evaluation and management by telemedicine and the availability of in person appointments. The patient expressed understanding and agreed to proceed.  History of Present Illness: Chief Complaint  Patient presents with  . Follow-up    No new concerns   This is a 23 yo female who presents today for virtual visit follow up of anxiety, depression and ADHD. 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 She reports that she had a bad month but that her stress level is manageable. She and her boyfriend are living together and this is going well.  She denies any medication side effects. She takes Adderall most days, not always on days off. Feels that concentration is good.   Past Medical History:  Diagnosis Date  . Depression    History reviewed. No pertinent surgical history. Family History  Problem Relation Age of Onset  . Diabetes Other   . Stroke Other   . Depression Mother   . Hyperlipidemia Father    Social History   Tobacco Use  . Smoking status: Never Smoker  . Smokeless tobacco: Never Used  Substance Use Topics  . Alcohol use: Yes    Comment: occassionally   . Drug use: Never      Observations/Objective: She is alert and answers questions appropriately. Visible skin is unremarkable. She speaks in complete sentences without shortness of breath, audible wheeze or witnessed cough. Mood and affect are appropriate.   Ht 5\' 5"  (1.651 m)   Wt 140 lb (63.5 kg) Comment: pt does not have a scale at home - guessing todays weight  BMI 23.30 kg/m  Wt Readings from Last 3 Encounters:  12/19/18 140 lb (63.5 kg)  08/17/18  145 lb (65.8 kg)  06/18/18 142 lb 12 oz (64.8 kg)   BP Readings from Last 3 Encounters:  08/18/18 124/79  06/18/18 102/60  02/16/18 122/70    Assessment and Plan: 1. Attention deficit hyperactivity disorder (ADHD), combined type - amphetamine-dextroamphetamine (ADDERALL XR) 20 MG 24 hr capsule; Take 1 capsule (20 mg total) by mouth every morning.  Dispense: 30 capsule; Refill: 0 - amphetamine-dextroamphetamine (ADDERALL XR) 20 MG 24 hr capsule; Take 1 capsule (20 mg total) by mouth every morning.  Dispense: 30 capsule; Refill: 0 - amphetamine-dextroamphetamine (ADDERALL XR) 20 MG 24 hr capsule; Take 1 capsule (20 mg total) by mouth daily.  Dispense: 30 capsule; Refill: 0  2. Anxiety and depression - doing well on paroxetine 30 mg  - follow up in 6 months   Clarene Reamer, FNP-BC  [facility]  12/19/2018 1:37 PM   Follow Up Instructions:    I discussed the assessment and treatment plan with the patient. The patient was provided an opportunity to ask questions and all were answered. The patient agreed with the plan and demonstrated an understanding of the instructions.   The patient was advised to call back or seek an in-person evaluation if the symptoms worsen or if the condition fails to improve as anticipated.    Elby Beck, FNP

## 2018-12-20 ENCOUNTER — Ambulatory Visit: Payer: PRIVATE HEALTH INSURANCE

## 2019-03-05 ENCOUNTER — Ambulatory Visit (INDEPENDENT_AMBULATORY_CARE_PROVIDER_SITE_OTHER): Payer: PRIVATE HEALTH INSURANCE | Admitting: *Deleted

## 2019-03-05 DIAGNOSIS — Z308 Encounter for other contraceptive management: Secondary | ICD-10-CM | POA: Diagnosis not present

## 2019-03-05 MED ORDER — MEDROXYPROGESTERONE ACETATE 150 MG/ML IM SUSP
150.0000 mg | Freq: Once | INTRAMUSCULAR | Status: AC
  Administered 2019-03-05: 150 mg via INTRAMUSCULAR

## 2019-03-05 NOTE — Progress Notes (Addendum)
Per orders of Dr. Damita Dunnings in Clarene Reamer, FNP absence, injection of Depo Provera 150mg /mL given by Tyleek Smick M. Patient tolerated injection well.   Agree.  Thanks.  Elsie Stain 03/06/19

## 2019-05-21 ENCOUNTER — Ambulatory Visit: Payer: PRIVATE HEALTH INSURANCE

## 2019-05-30 ENCOUNTER — Other Ambulatory Visit: Payer: Self-pay

## 2019-05-30 ENCOUNTER — Ambulatory Visit (INDEPENDENT_AMBULATORY_CARE_PROVIDER_SITE_OTHER): Payer: PRIVATE HEALTH INSURANCE | Admitting: *Deleted

## 2019-05-30 DIAGNOSIS — Z308 Encounter for other contraceptive management: Secondary | ICD-10-CM

## 2019-05-30 MED ORDER — MEDROXYPROGESTERONE ACETATE 150 MG/ML IM SUSP
150.0000 mg | Freq: Once | INTRAMUSCULAR | Status: AC
  Administered 2019-05-30: 09:00:00 150 mg via INTRAMUSCULAR

## 2019-05-30 NOTE — Progress Notes (Signed)
Per orders of Deboraha Sprang, NP, injection of Depo Provera given by Ileana Ladd. Patient tolerated injection well.  Next Injection Due: April 22 through Aug 29 2019.

## 2019-08-19 ENCOUNTER — Telehealth: Payer: Self-pay

## 2019-08-19 NOTE — Telephone Encounter (Signed)
Pt has NV tomorrow and needs screened for covid.  LVM 

## 2019-08-20 ENCOUNTER — Ambulatory Visit (INDEPENDENT_AMBULATORY_CARE_PROVIDER_SITE_OTHER): Payer: PRIVATE HEALTH INSURANCE

## 2019-08-20 ENCOUNTER — Other Ambulatory Visit: Payer: Self-pay

## 2019-08-20 DIAGNOSIS — Z308 Encounter for other contraceptive management: Secondary | ICD-10-CM | POA: Diagnosis not present

## 2019-08-20 MED ORDER — MEDROXYPROGESTERONE ACETATE 150 MG/ML IM SUSP
150.0000 mg | Freq: Once | INTRAMUSCULAR | Status: AC
  Administered 2019-08-20: 09:00:00 150 mg via INTRAMUSCULAR

## 2019-08-20 NOTE — Progress Notes (Signed)
Per orders of Dr. Ermalene Searing in [facility] absenced, injection of depo-provera  given by Sherrie George. Patient tolerated injection well.

## 2019-11-04 ENCOUNTER — Encounter: Payer: Self-pay | Admitting: Family Medicine

## 2019-11-04 ENCOUNTER — Telehealth (INDEPENDENT_AMBULATORY_CARE_PROVIDER_SITE_OTHER): Payer: BC Managed Care – PPO | Admitting: Family Medicine

## 2019-11-04 VITALS — Ht 65.0 in | Wt 143.0 lb

## 2019-11-04 DIAGNOSIS — R1084 Generalized abdominal pain: Secondary | ICD-10-CM

## 2019-11-04 DIAGNOSIS — R55 Syncope and collapse: Secondary | ICD-10-CM | POA: Diagnosis not present

## 2019-11-04 NOTE — Progress Notes (Signed)
Virtual Visit via Video Note  I connected with Kathleen Robinson on 11/04/19 at  8:30 AM EDT by a video enabled telemedicine application and verified that I am speaking with the correct person using two identifiers.  Location: Patient: Parked in her vehicle Provider: [facility] Persons participating in virtual visit: patient and provider   I discussed the limitations of evaluation and management by telemedicine and the availability of in person appointments. The patient expressed understanding and agreed to proceed.  History of Present Illness: Chief Complaint  Patient presents with  . Emesis    Vomiting and diarrhea since Feb. Pt states that she has missed a lot of work bc of these symptoms.  Pt also having some dizzy spells. Home Preg Test negative. Pt states that she had a fainting episode - last episode in April.    This is a 24 yo female who presents today for virtual visit for above cc.   Abdominal pain/ vomiting/ diarrhea- has had abdominal pain, feeling of "tightness," with intermittent vomiting since 2/21. Was vomiting every 1 to 1.5 weeks x 1. Intermittent diarrhea alternating with normal BMs. Has history of "irregular" bowel movements. Started pepcid once a day about 10 days ago and has not had any further vomiting. Abdominal pain triggered by greasy and acidic foods, coffee. Eating schedule is irregular due to job. Started fiber pill daily. Drinks about 4 glasses of water a day. Thinks fiber is helping.   Had a syncopal episode about three months ago. Was in kitchen preparing food when saw black spots and felt like she was going to pass out, she squatted down and very briefly lost consciousness (she thinks for a couple of seconds). She had recently eaten some grapes. Felt fine afterwards. No further episodes. Prior episode x 1 occurred 4 years ago. Unable to identify a trigger.   Observations/Objective: Patient is alert and answers questions appropriately. Respirations even  and unlabored, no witnessed cough or audible wheeze. Visible skin is unremarkable. Mood and affect appropriate.   Ht 5\' 5"  (1.651 m)   Wt 143 lb (64.9 kg)   BMI 23.80 kg/m  Wt Readings from Last 3 Encounters:  11/04/19 143 lb (64.9 kg)  12/19/18 140 lb (63.5 kg)  08/17/18 145 lb (65.8 kg)    Assessment and Plan: 1. Syncope, unspecified syncope type - very spaced out episodes- 2 in 4 years, unknown trigger, will check labs - CBC with Differential/Platelet; Future - Ferritin; Future - Comprehensive metabolic panel; Future - TSH; Future  2. Generalized abdominal pain - some improvement with famotidine daily, will have her increase to twice a day, avoid triggers, check labs - follow up precautions reviewed - CBC with Differential/Platelet; Future - Ferritin; Future - Comprehensive metabolic panel; Future   08/19/18, FNP-BC  [facility]  11/04/2019 11:45 AM   Follow Up Instructions:    I discussed the assessment and treatment plan with the patient. The patient was provided an opportunity to ask questions and all were answered. The patient agreed with the plan and demonstrated an understanding of the instructions.   The patient was advised to call back or seek an in-person evaluation if the symptoms worsen or if the condition fails to improve as anticipated.   01/05/2020, FNP

## 2019-11-05 ENCOUNTER — Other Ambulatory Visit: Payer: Self-pay

## 2019-11-05 ENCOUNTER — Other Ambulatory Visit (INDEPENDENT_AMBULATORY_CARE_PROVIDER_SITE_OTHER): Payer: BC Managed Care – PPO

## 2019-11-05 ENCOUNTER — Ambulatory Visit (INDEPENDENT_AMBULATORY_CARE_PROVIDER_SITE_OTHER): Payer: BC Managed Care – PPO

## 2019-11-05 DIAGNOSIS — R55 Syncope and collapse: Secondary | ICD-10-CM

## 2019-11-05 DIAGNOSIS — Z308 Encounter for other contraceptive management: Secondary | ICD-10-CM | POA: Diagnosis not present

## 2019-11-05 DIAGNOSIS — R1084 Generalized abdominal pain: Secondary | ICD-10-CM | POA: Diagnosis not present

## 2019-11-05 LAB — TSH: TSH: 3.14 u[IU]/mL (ref 0.35–4.50)

## 2019-11-05 LAB — COMPREHENSIVE METABOLIC PANEL
ALT: 6 U/L (ref 0–35)
AST: 16 U/L (ref 0–37)
Albumin: 4.6 g/dL (ref 3.5–5.2)
Alkaline Phosphatase: 69 U/L (ref 39–117)
BUN: 11 mg/dL (ref 6–23)
CO2: 26 mEq/L (ref 19–32)
Calcium: 9.6 mg/dL (ref 8.4–10.5)
Chloride: 107 mEq/L (ref 96–112)
Creatinine, Ser: 0.83 mg/dL (ref 0.40–1.20)
GFR: 82.94 mL/min (ref >60.00)
Glucose, Bld: 66 mg/dL — ABNORMAL LOW (ref 70–99)
Potassium: 4.5 mEq/L (ref 3.5–5.1)
Sodium: 139 mEq/L (ref 135–145)
Total Bilirubin: 0.5 mg/dL (ref 0.2–1.2)
Total Protein: 7.2 g/dL (ref 6.0–8.3)

## 2019-11-05 LAB — CBC WITH DIFFERENTIAL/PLATELET
Basophils Absolute: 0.1 10*3/uL (ref 0.0–0.1)
Basophils Relative: 1.1 % (ref 0.0–3.0)
Eosinophils Absolute: 0.5 10*3/uL (ref 0.0–0.7)
Eosinophils Relative: 8.3 % — ABNORMAL HIGH (ref 0.0–5.0)
HCT: 37.8 % (ref 36.0–46.0)
Hemoglobin: 12.6 g/dL (ref 12.0–15.0)
Lymphocytes Relative: 35.2 % (ref 12.0–46.0)
Lymphs Abs: 2 10*3/uL (ref 0.7–4.0)
MCHC: 33.4 g/dL (ref 30.0–36.0)
MCV: 81.2 fl (ref 78.0–100.0)
Monocytes Absolute: 0.4 10*3/uL (ref 0.1–1.0)
Monocytes Relative: 6.7 % (ref 3.0–12.0)
Neutro Abs: 2.7 10*3/uL (ref 1.4–7.7)
Neutrophils Relative %: 48.7 % (ref 43.0–77.0)
Platelets: 277 10*3/uL (ref 150.0–400.0)
RBC: 4.65 Mil/uL (ref 3.87–5.11)
RDW: 13 % (ref 11.5–15.5)
WBC: 5.6 10*3/uL (ref 4.0–10.5)

## 2019-11-05 LAB — FERRITIN: Ferritin: 5.9 ng/mL — ABNORMAL LOW (ref 10.0–291.0)

## 2019-11-05 MED ORDER — MEDROXYPROGESTERONE ACETATE 150 MG/ML IM SUSP
150.0000 mg | Freq: Once | INTRAMUSCULAR | Status: AC
  Administered 2019-11-05: 150 mg via INTRAMUSCULAR

## 2019-11-05 NOTE — Progress Notes (Signed)
Per orders of [name], NP, injection of Depo-Provera given by Renee Rival, CMA Patient tolerated injection well.

## 2019-11-11 DIAGNOSIS — R112 Nausea with vomiting, unspecified: Secondary | ICD-10-CM | POA: Diagnosis not present

## 2019-11-11 DIAGNOSIS — R6883 Chills (without fever): Secondary | ICD-10-CM | POA: Diagnosis not present

## 2019-11-11 DIAGNOSIS — R197 Diarrhea, unspecified: Secondary | ICD-10-CM | POA: Diagnosis not present

## 2019-11-11 DIAGNOSIS — Z79899 Other long term (current) drug therapy: Secondary | ICD-10-CM | POA: Diagnosis not present

## 2019-11-11 DIAGNOSIS — R1110 Vomiting, unspecified: Secondary | ICD-10-CM | POA: Diagnosis not present

## 2019-12-10 DIAGNOSIS — F4322 Adjustment disorder with anxiety: Secondary | ICD-10-CM | POA: Diagnosis not present

## 2019-12-23 ENCOUNTER — Telehealth: Payer: Self-pay | Admitting: Family Medicine

## 2019-12-23 DIAGNOSIS — F332 Major depressive disorder, recurrent severe without psychotic features: Secondary | ICD-10-CM

## 2019-12-24 NOTE — Telephone Encounter (Signed)
Please schedule CPE with Debbie.

## 2020-01-06 ENCOUNTER — Telehealth: Payer: Self-pay

## 2020-01-06 NOTE — Telephone Encounter (Signed)
Please call her to schedule follow-up for ADHD prior to represcribing medication.

## 2020-01-06 NOTE — Telephone Encounter (Signed)
Patient contacted the office and states she voluntarily stopped taking her Adderall XR back in December of 2020. She states she would like to restart this medication, and is wondering if Eunice Blase will refill this for her - or if she will need an OV first? Patient was last seen by video visit on 11/04/19.

## 2020-01-08 NOTE — Telephone Encounter (Signed)
10/1 appointment pt aware

## 2020-01-09 NOTE — Telephone Encounter (Signed)
Appointment 10/6 med refill

## 2020-01-29 ENCOUNTER — Telehealth (INDEPENDENT_AMBULATORY_CARE_PROVIDER_SITE_OTHER): Payer: BC Managed Care – PPO | Admitting: Family Medicine

## 2020-01-29 ENCOUNTER — Other Ambulatory Visit: Payer: Self-pay

## 2020-01-29 ENCOUNTER — Encounter: Payer: Self-pay | Admitting: Family Medicine

## 2020-01-29 VITALS — HR 100

## 2020-01-29 DIAGNOSIS — F4322 Adjustment disorder with anxiety: Secondary | ICD-10-CM | POA: Diagnosis not present

## 2020-01-29 DIAGNOSIS — F32A Depression, unspecified: Secondary | ICD-10-CM

## 2020-01-29 DIAGNOSIS — F419 Anxiety disorder, unspecified: Secondary | ICD-10-CM | POA: Diagnosis not present

## 2020-01-29 DIAGNOSIS — F902 Attention-deficit hyperactivity disorder, combined type: Secondary | ICD-10-CM | POA: Diagnosis not present

## 2020-01-29 MED ORDER — AMPHETAMINE-DEXTROAMPHET ER 20 MG PO CP24: 20.0000 mg | ORAL_CAPSULE | ORAL | 0 refills | Status: DC

## 2020-01-29 MED ORDER — AMPHETAMINE-DEXTROAMPHET ER 20 MG PO CP24: 20.0000 mg | ORAL_CAPSULE | Freq: Every day | ORAL | 0 refills | Status: DC
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
## 2020-04-02 ENCOUNTER — Other Ambulatory Visit: Payer: Self-pay | Admitting: Family Medicine

## 2020-04-02 DIAGNOSIS — F902 Attention-deficit hyperactivity disorder, combined type: Secondary | ICD-10-CM

## 2020-04-02 DIAGNOSIS — F332 Major depressive disorder, recurrent severe without psychotic features: Secondary | ICD-10-CM

## 2020-04-02 NOTE — Telephone Encounter (Signed)
LAST APPOINTMENT DATE: 01/29/2020  NEXT APPOINTMENT DATE: Visit date not found    LAST REFILL: she should have one more script for adderall xr that she could fill in December

## 2020-04-03 MED ORDER — PAROXETINE HCL 30 MG PO TABS: 30.0000 mg | ORAL_TABLET | Freq: Every day | ORAL | 1 refills | Status: DC

## 2020-04-09 ENCOUNTER — Telehealth: Payer: Self-pay

## 2020-04-09 NOTE — Telephone Encounter (Signed)
Fax requesting PA on [email or web address] for paxil.  PA started on covermymeds and is pending.

## 2020-04-10 NOTE — Telephone Encounter (Signed)
Pa has been denied.   Contacted pt and advised. Pt reports she no longer has that insurance and does not have [facility] any longer. Advised pt about [facility] she said she has used them in the past and has been able to get some insurance recently and has been able to afford her med currently. She wanted number to [facility] to establish care there. Gave her the number and advised if anything is needed contact this office. Pt very appreciative and wanted to thank office for all everything they have done.

## 2020-06-02 ENCOUNTER — Other Ambulatory Visit: Payer: Self-pay

## 2020-06-03 ENCOUNTER — Encounter: Payer: Self-pay | Admitting: Family Medicine

## 2020-06-03 ENCOUNTER — Ambulatory Visit (INDEPENDENT_AMBULATORY_CARE_PROVIDER_SITE_OTHER): Payer: No Typology Code available for payment source | Admitting: Family Medicine

## 2020-06-03 VITALS — BP 128/76 | HR 81 | Temp 98.8°F | Resp 18 | Ht 65.5 in | Wt 167.0 lb

## 2020-06-03 DIAGNOSIS — F332 Major depressive disorder, recurrent severe without psychotic features: Secondary | ICD-10-CM | POA: Diagnosis not present

## 2020-06-03 DIAGNOSIS — T1491XA Suicide attempt, initial encounter: Secondary | ICD-10-CM

## 2020-06-03 DIAGNOSIS — F419 Anxiety disorder, unspecified: Secondary | ICD-10-CM

## 2020-06-03 DIAGNOSIS — Z3042 Encounter for surveillance of injectable contraceptive: Secondary | ICD-10-CM | POA: Diagnosis not present

## 2020-06-03 DIAGNOSIS — F902 Attention-deficit hyperactivity disorder, combined type: Secondary | ICD-10-CM | POA: Diagnosis not present

## 2020-06-03 DIAGNOSIS — F32A Depression, unspecified: Secondary | ICD-10-CM

## 2020-06-03 LAB — POCT URINE PREGNANCY: Preg Test, Ur: NEGATIVE

## 2020-06-03 MED ORDER — PAROXETINE HCL 30 MG PO TABS: 30.0000 mg | ORAL_TABLET | Freq: Every day | ORAL | 1 refills | Status: DC

## 2020-06-03 MED ORDER — MEDROXYPROGESTERONE ACETATE 150 MG/ML IM SUSP
150.0000 mg | Freq: Once | INTRAMUSCULAR | Status: AC
  Administered 2020-06-03: 150 mg via INTRAMUSCULAR

## 2020-06-03 MED ORDER — AMPHETAMINE-DEXTROAMPHET ER 20 MG PO CP24: 20.0000 mg | ORAL_CAPSULE | ORAL | 0 refills | Status: DC

## 2020-06-03 MED ORDER — AMPHETAMINE-DEXTROAMPHET ER 20 MG PO CP24: 20.0000 mg | ORAL_CAPSULE | Freq: Every day | ORAL | 0 refills | Status: DC

## 2020-06-03 NOTE — Patient Instructions (Signed)
Very nice to meet you today.   All controlled substances require a face to face appt for refills every 3 mos.  A controlled substance contract is required to continue scripts.  Urine drug screening at onset of script and randomly at least once a year.   Yearly physicals encouraged. Up to date cervical cancer screenings required for continuation of depo provera. (we will get you updated at your physical).   Please help [place] help you:  We are honored you have chosen [facility] for your Primary Care home. Below you will find basic instructions that you may need to access in the future. Please help [place] help you by reading the instructions, which cover many of the frequent questions we experience.   Prescription refills and request:  -In order to allow more efficient response time, please call your pharmacy for all refills. They will forward the request electronically to [place]. This allows for the quickest possible response. Request left on a nurse line can take longer to refill, since these are checked as time allows between office patients and other phone calls.  - refill request can take up to 3-5 working days to complete.  - If request is sent electronically and request is appropiate, it is usually completed in 1-2 business days.  - all patients will need to be seen routinely for all chronic medical conditions requiring prescription medications (see follow-up below). If you are overdue for follow up on your condition, you will be asked to make an appointment and we will call in enough medication to cover you until your appointment (up to 30 days).  - all controlled substances will require a face to face visit to request/refill.  - if you desire your prescriptions to go through a new pharmacy, and have an active script at original pharmacy, you will need to call your pharmacy and have scripts transferred to new pharmacy. This is completed between the pharmacy locations and not by your provider.     Results: Our office handles many outgoing and incoming calls daily. If we have not contacted you within 1 week about your results, please check your mychart to see if there is a message first and if not, then contact our office.  In helping with this matter, you help decrease call volume, and therefore allow [place] to be able to respond to patients needs more efficiently.  We will always attempt to call you with results,  normal or abnormal. However, if we are unable to reach you we will send a message in your my chart with results.   Acute office visits (sick visit):  An acute visit is intended for a new problem and are scheduled in shorter time slots to allow schedule openings for patients with new problems. This is the appropriate visit to discuss a new problem. Problems will not be addressed by phone call or Echart message. Appointment is needed if requesting treatment. In order to provide you with excellent quality medical care with proper time for you to explain your problem, have an exam and receive treatment with instructions, these appointments should be limited to one new problem per visit. If you experience a new problem, in which you desire to be addressed, please make an acute office visit, we save openings on the schedule to accommodate you. Please do not save your new problem for any other type of visit, let us take care of it properly and quickly for you.   Follow up visits:  Depending on your  condition(s) your provider will need to see you routinely in order to provide you with quality care and prescribe medication(s). Most chronic conditions (Example: hypertension, Diabetes, depression/anxiety... etc), require visits a couple times a year. Your provider will instruct you on proper follow up for your personal medical conditions and history. Please make certain to make follow up appointments for your condition as instructed. Failing to do so could result in lapse in your medication  treatment/refills. If you request a refill, and are overdue to be seen on a condition, we will always provide you with a 30 day script (once) to allow you time to schedule.      Yearly physical (well visit):  - Adults are recommended to be seen yearly for physicals. Check with your insurance and date of your last physical, most insurances require one calendar year between physicals. Physicals include all preventive health topics, screenings, medical exam and labs that are appropriate for gender/age and history. You may have fasting labs needed at this visit. This is a well visit (not a sick visit), new problems should not be covered during this visit (see acute visit).  - Pediatric patients are seen more frequently when they are younger. Your provider will advise you on well child visit timing that is appropriate for your their age. - This is not a medicare wellness visit. Medicare wellness exams do not have an exam portion to the visit. Some medicare companies allow for a physical, some do not allow a yearly physical. If your medicare allows a yearly physical you can schedule the medicare wellness with our nurse Selena Batten and have your physical with your provider after, on the same day. Please check with insurance for your full benefits.   Late Policy/No Shows:  - all new patients should arrive 15-30 minutes earlier than appointment to allow [place] time  to  obtain all personal demographics,  insurance information and for you to complete office paperwork. - All established patients should arrive 10-15 minutes earlier than appointment time to update all information and be checked in .  - In our best efforts to run on time, if you are late for your appointment you will be asked to either reschedule or if able, we will work you back into the schedule. There will be a wait time to work you back in the schedule,  depending on availability.  - If you are unable to make it to your appointment as scheduled, please call  24 hours ahead of time to allow [place] to fill the time slot with someone else who needs to be seen. If you do not cancel your appointment ahead of time, you may be charged a no show fee.

## 2020-06-03 NOTE — Progress Notes (Unsigned)
Patient ID: Kathleen Robinson, female  DOB: 21-Jun-1995, 25 y.o.   MRN: [id number] Patient Care Team    Relationship Specialty Notifications Start End  Natalia Leatherwood, DO PCP - General Family Medicine  06/03/20     Chief Complaint  Patient presents with  . Transitions Of Care    Establishing care from Dr Leone Payor:  Pt says she has been off of her Adderall 20 mg and needs to restart. She would like to restart depo shots.     Subjective:  Kathleen Robinson is a 25 y.o.  female present for new patient establishment/TOC All past medical history, surgical history, allergies, family history, immunizations, medications and social history were updated in the electronic medical record today. All recent labs, ED visits and hospitalizations within the last year were reviewed.  Attention deficit hyperactivity disorder (ADHD), combined type Patient reports she was diagnosed with ADHD when she was young child.  She had started back on Adderall last year.  She does feel she needs to continue medications to help with her focus.  She has been out of this medication for some time.  Severe recurrent major depression without psychotic features (HCC)/H/o Suicide attempt (HCC)/Anxiety and depression Patient reports she has been diagnosed with depression, anxiety and mood disorder at the age of 39.  At that time she was tried on Zoloft and one other assess RI medication that did not work well for her.  Paxil was also tried at that time and worked great.  She states "it changed her life.  "She has sought out counseling as an adult and is currently attending counseling sessions with Rolly Salter at [facility].  She does have a history of suicide attempt in August 2019.  At that time she had just come back from [place] and had a traumatic experience in [place].  She reports when she did suicide she had taken pills.  She was admitted to the behavioral health unit.  She has had no suicidal ideations since that  time.  She reports with her illness she can be straightforward and blunt.  She feels some people takes her as being rude but she does not necessarily mean her actions in a rude manner.  By her own words, she can be "vindictive.  "She recently quit her job.  She felt the manager was controlling and mean.  Patient states she quit the job right before shift so that there was not coverage. She is currently engaged as of 11/2019.  She reports her fianc is bipolar and has had a traumatic experience. paxil 30  Depo-Provera contraceptive status Patient's last menstrual period was 05/11/2020 (exact date). Last depo provera injection July 2021. She would like to restart. She has not had a PAP for about 3-4 yrs. She is sexually active, currently with her female fiance.    Depression screen [facility] 2/9 06/03/2020 01/29/2020 11/04/2019 08/15/2018 06/18/2018  Decreased Interest 1 0 0 0 1  Down, Depressed, Hopeless 0 2 0 1 2  PHQ - 2 Score 1 2 0 1 3  Altered sleeping 0 1 - 1 0  Tired, decreased energy 3 2 - 2 2  Change in appetite 1 0 - 0 0  Feeling bad or failure about yourself  0 1 - 1 3  Trouble concentrating 3 3 - 0 1  Moving slowly or fidgety/restless 2 1 - 0 0  Suicidal thoughts 0 0 - 0 1  PHQ-9 Score 10 10 - 5 10  Difficult doing  work/chores Somewhat difficult Somewhat difficult - Not difficult at all Very difficult   GAD 7 : Generalized Anxiety Score 01/29/2020 08/15/2018 06/18/2018  Nervous, Anxious, on Edge 1 0 1  Control/stop worrying 0 0 0  Worry too much - different things 1 1 1   Trouble relaxing 0 0 0  Restless 0 0 0  Easily annoyed or irritable 1 1 2   Afraid - awful might happen 0 0 0  Total GAD 7 Score 3 2 4   Anxiety Difficulty Somewhat difficult Not difficult at all Very difficult       No flowsheet data found.  Immunization History  Administered Date(s) Administered  . Hepatitis A 11/29/2010, 06/01/2011  . Influenza-Unspecified 02/22/2012, 05/01/2014, 05/06/2015, 02/06/2019, 03/25/2020   . Meningococcal Conjugate 11/29/2010  . PFIZER(Purple Top)SARS-COV-2 Vaccination 09/10/2019, 10/01/2019, 04/25/2020  . Tdap 11/17/2015    No exam data present  Past Medical History:  Diagnosis Date  . Depression   . Postural dizziness with presyncope    Allergies  Allergen Reactions  . Wasp Venom Swelling   Past Surgical History:  Procedure Laterality Date  . NO PAST SURGERIES     Family History  Problem Relation Age of Onset  . Diabetes Other   . Stroke Other   . Depression Mother   . Hyperlipidemia Father   . Depression Maternal Grandmother   . Alcohol abuse Maternal Grandmother   . Heart attack Maternal Grandfather   . Arthritis Paternal Grandmother   . Heart disease Paternal Grandmother   . Hypertension Paternal Grandmother   . Hyperlipidemia Paternal Grandmother   . Miscarriages / Stillbirths Paternal Grandmother   . Mitral valve prolapse Paternal Grandmother   . Alcohol abuse Paternal Grandfather   . Depression Paternal Grandfather   . Heart disease Paternal Grandfather   . Hypertension Paternal Grandfather   . Hyperlipidemia Paternal Grandfather    Social History   Social History Narrative   Marital status/children/pets: Single   Education/employment: B.A. 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 If you have any questions, ask your nurse or doctor.        amphetamine-dextroamphetamine 20 MG 24 hr capsule Commonly known as: Adderall XR Take 1 capsule (20 mg total) by mouth daily.   amphetamine-dextroamphetamine 20 MG 24 hr capsule Commonly known as: ADDERALL XR Take 1 capsule (20 mg total) by mouth every morning.   amphetamine-dextroamphetamine 20 MG 24 hr  capsule Commonly known as: ADDERALL XR Take 1 capsule (20 mg total) by mouth every morning.   PARoxetine 30 MG tablet Commonly known as: PAXIL Take 1 tablet (30 mg total) by mouth daily.       All past medical history, surgical history, allergies, family history, immunizations andmedications were updated in the EMR today and reviewed under the history and medication portions of their EMR.    Recent Results (from the past 2160 hour(s))  POCT urine pregnancy     Status: Normal   Collection Time: 06/03/20  9:43 AM  Result Value Ref Range   Preg Test, Ur Negative Negative  DG Chest Portable 1 View Result Date: 08/17/2018  IMPRESSION: No active disease. Electronically Signed   By: Jasmine Pang M.D.   On: 08/17/2018 23:02    ROS: 14 pt review of systems performed and negative (unless mentioned in an HPI)  Objective: BP 128/76 (BP Location: Right Arm, Patient Position: Sitting, Cuff Size: Normal)   Pulse 81   Temp 98.8 F (37.1 C) (Oral)   Resp 18   Ht 5' 5.5" (1.664 m)   Wt 167 lb (75.8 kg)   SpO2 97%   BMI 27.37 kg/m  Gen: Afebrile. No acute distress. Nontoxic in appearance, well-developed, well-nourished,  Very pleasant.  HENT: AT. [place].  Eyes:Pupils Equal Round Reactive to light, Extraocular movements intact,  Conjunctiva without redness, discharge or icterus. Neck/lymp/endocrine: Supple,no lymphadenopathy, no thyromegaly CV: RRR no murmur, no edema, +2/4 P posterior tibialis pulses.  Chest: CTAB, no wheeze, rhonchi or crackles.  Skin: no rashes, purpura or petechiae. Warm and well-perfused. Skin intact. Neuro/Msk:  Normal gait. PERLA. EOMi. Alert. Oriented x3.   Psych: Normal affect, dress and demeanor. Normal speech. Normal thought content and judgment.  Results for orders placed or performed in visit on 06/03/20 (from the past 24 hour(s))  POCT urine pregnancy     Status: Normal   Collection Time: 06/03/20  9:43 AM  Result Value Ref Range   Preg Test, Ur Negative  Negative    Assessment/plan: Kaylynne Andres is a 25 y.o. female present for TOC Attention deficit hyperactivity disorder (ADHD), combined type Stable on meds- has been without.  continue adderall Xr 20> x3 scripts sent.  Controlled substance contract signed.  UDS collected [facility] database reviewed 06/03/20 - DRUG MONITORING, PANEL 8 WITH CONFIRMATION, URINE F/u q 3 mos.   Anxiety and depression/h/o Suicide attempt (HCC)/Severe recurrent major depression without psychotic features (HCC) Stable.  - Continue PARoxetine (PAXIL) 30 MG tablet QD - continue counseling.   Depo-Provera contraceptive status Depo provera administered today- next due Apr. 27-May 11 - POCT urine pregnancy> negative   Return in about 3 months (around 08/24/2020) for CPE (30 min), CMC (30 min).  Orders Placed This Encounter  Procedures  . DRUG MONITORING, PANEL 8 WITH CONFIRMATION, URINE  . POCT urine pregnancy   Meds ordered this encounter  Medications  . PARoxetine (PAXIL) 30 MG tablet    Sig: Take 1 tablet (30 mg total) by mouth daily.    Dispense:  90 tablet    Refill:  1    Please DC other scripts  . amphetamine-dextroamphetamine (ADDERALL XR) 20 MG 24 hr capsule    Sig: Take 1 capsule (20 mg total) by mouth daily.    Dispense:  30 capsule    Refill:  0  . amphetamine-dextroamphetamine (ADDERALL XR) 20 MG 24 hr capsule    Sig: Take 1 capsule (20 mg total) by mouth every morning.    Dispense:  30 capsule    Refill:  0    May fill ~60 days after date on prescription.  Marland Kitchen amphetamine-dextroamphetamine (ADDERALL XR) 20 MG 24 hr capsule    Sig: Take 1 capsule (20 mg total) by mouth every morning.    Dispense:  30 capsule    Refill:  0    May fill ~30 days after prescription date   Referral Orders  No referral(s) requested today    Note is dictated utilizing voice recognition software. Although note has been proof read prior to signing, occasional typographical errors still can be missed. If any  questions  arise, please do not hesitate to call for verification.  Electronically signed by: Howard Pouch, DO [place]

## 2020-06-05 LAB — DRUG MONITORING, PANEL 8 WITH CONFIRMATION, URINE
6 Acetylmorphine: NEGATIVE ng/mL (ref <10)
Alcohol Metabolites: POSITIVE ng/mL — AB
Amphetamines: NEGATIVE ng/mL (ref <500)
Benzodiazepines: NEGATIVE ng/mL (ref <100)
Buprenorphine, Urine: NEGATIVE ng/mL (ref <5)
Cocaine Metabolite: NEGATIVE ng/mL (ref <150)
Creatinine: 127.9 mg/dL
Ethyl Glucuronide (ETG): NEGATIVE ng/mL (ref <500)
Ethyl Sulfate (ETS): 167 ng/mL — ABNORMAL HIGH (ref <100)
MDMA: NEGATIVE ng/mL (ref <500)
Marijuana Metabolite: 957 ng/mL — ABNORMAL HIGH (ref <5)
Marijuana Metabolite: POSITIVE ng/mL — AB (ref <20)
Opiates: NEGATIVE ng/mL (ref <100)
Oxidant: NEGATIVE ug/mL
Oxycodone: NEGATIVE ng/mL (ref <100)
pH: 7.2 (ref 4.5–9.0)

## 2020-06-05 LAB — DM TEMPLATE

## 2020-06-09 DIAGNOSIS — F4322 Adjustment disorder with anxiety: Secondary | ICD-10-CM | POA: Diagnosis not present

## 2020-06-23 DIAGNOSIS — F4312 Post-traumatic stress disorder, chronic: Secondary | ICD-10-CM | POA: Diagnosis not present

## 2020-06-23 DIAGNOSIS — F332 Major depressive disorder, recurrent severe without psychotic features: Secondary | ICD-10-CM | POA: Diagnosis not present

## 2020-07-07 DIAGNOSIS — F4312 Post-traumatic stress disorder, chronic: Secondary | ICD-10-CM | POA: Diagnosis not present

## 2020-07-07 DIAGNOSIS — F332 Major depressive disorder, recurrent severe without psychotic features: Secondary | ICD-10-CM | POA: Diagnosis not present

## 2020-07-21 DIAGNOSIS — F332 Major depressive disorder, recurrent severe without psychotic features: Secondary | ICD-10-CM | POA: Diagnosis not present

## 2020-07-21 DIAGNOSIS — F4312 Post-traumatic stress disorder, chronic: Secondary | ICD-10-CM | POA: Diagnosis not present

## 2020-08-04 DIAGNOSIS — F332 Major depressive disorder, recurrent severe without psychotic features: Secondary | ICD-10-CM | POA: Diagnosis not present

## 2020-08-04 DIAGNOSIS — F4312 Post-traumatic stress disorder, chronic: Secondary | ICD-10-CM | POA: Diagnosis not present

## 2020-08-24 ENCOUNTER — Encounter: Payer: No Typology Code available for payment source | Admitting: Family Medicine

## 2020-09-01 DIAGNOSIS — F332 Major depressive disorder, recurrent severe without psychotic features: Secondary | ICD-10-CM | POA: Diagnosis not present

## 2020-09-01 DIAGNOSIS — F4312 Post-traumatic stress disorder, chronic: Secondary | ICD-10-CM | POA: Diagnosis not present

## 2020-09-09 ENCOUNTER — Ambulatory Visit (INDEPENDENT_AMBULATORY_CARE_PROVIDER_SITE_OTHER): Payer: BC Managed Care – PPO | Admitting: Family Medicine

## 2020-09-09 ENCOUNTER — Encounter: Payer: Self-pay | Admitting: Family Medicine

## 2020-09-09 ENCOUNTER — Other Ambulatory Visit (HOSPITAL_COMMUNITY)
Admission: RE | Admit: 2020-09-09 | Discharge: 2020-09-09 | Disposition: A | Discharge status: Home / Self Care | Payer: BC Managed Care – PPO | Source: Ambulatory Visit | Attending: Family Medicine | Admitting: Family Medicine

## 2020-09-09 ENCOUNTER — Other Ambulatory Visit: Payer: Self-pay

## 2020-09-09 VITALS — BP 117/77 | HR 73 | Temp 98.1°F | Resp 16 | Ht 65.0 in | Wt 153.4 lb

## 2020-09-09 DIAGNOSIS — F902 Attention-deficit hyperactivity disorder, combined type: Secondary | ICD-10-CM

## 2020-09-09 DIAGNOSIS — Z1159 Encounter for screening for other viral diseases: Secondary | ICD-10-CM | POA: Diagnosis not present

## 2020-09-09 DIAGNOSIS — Z79899 Other long term (current) drug therapy: Secondary | ICD-10-CM | POA: Diagnosis not present

## 2020-09-09 DIAGNOSIS — Z113 Encounter for screening for infections with a predominantly sexual mode of transmission: Secondary | ICD-10-CM | POA: Diagnosis not present

## 2020-09-09 DIAGNOSIS — F419 Anxiety disorder, unspecified: Secondary | ICD-10-CM | POA: Diagnosis not present

## 2020-09-09 DIAGNOSIS — Z124 Encounter for screening for malignant neoplasm of cervix: Secondary | ICD-10-CM | POA: Insufficient documentation

## 2020-09-09 DIAGNOSIS — F332 Major depressive disorder, recurrent severe without psychotic features: Secondary | ICD-10-CM

## 2020-09-09 DIAGNOSIS — Z0000 Encounter for general adult medical examination without abnormal findings: Secondary | ICD-10-CM | POA: Diagnosis not present

## 2020-09-09 DIAGNOSIS — Z114 Encounter for screening for human immunodeficiency virus [HIV]: Secondary | ICD-10-CM | POA: Diagnosis not present

## 2020-09-09 DIAGNOSIS — F32A Depression, unspecified: Secondary | ICD-10-CM

## 2020-09-09 DIAGNOSIS — Z131 Encounter for screening for diabetes mellitus: Secondary | ICD-10-CM

## 2020-09-09 DIAGNOSIS — Z13220 Encounter for screening for lipoid disorders: Secondary | ICD-10-CM

## 2020-09-09 DIAGNOSIS — Z130 Encounter for screening for diseases of the blood and blood-forming organs and certain disorders involving the immune mechanism: Secondary | ICD-10-CM

## 2020-09-09 MED ORDER — AMPHETAMINE-DEXTROAMPHET ER 20 MG PO CP24: 20.0000 mg | ORAL_CAPSULE | Freq: Every day | ORAL | 0 refills | Status: DC

## 2020-09-09 MED ORDER — AMPHETAMINE-DEXTROAMPHET ER 20 MG PO CP24: 20.0000 mg | ORAL_CAPSULE | ORAL | 0 refills | Status: DC

## 2020-09-09 MED ORDER — PAROXETINE HCL 30 MG PO TABS: 30.0000 mg | ORAL_TABLET | Freq: Every day | ORAL | 1 refills | Status: DC

## 2020-09-09 MED ORDER — PAROXETINE HCL 40 MG PO TABS: 40.0000 mg | ORAL_TABLET | Freq: Every day | ORAL | 1 refills | Status: DC

## 2020-09-09 NOTE — Progress Notes (Signed)
This visit occurred during the SARS-CoV-2 public health emergency.  Safety protocols were in place, including screening questions prior to the visit, additional usage of staff PPE, and extensive cleaning of exam room while observing appropriate contact time as indicated for disinfecting solutions.    Patient ID: Kathleen Robinson, female  DOB: 10/01/1995, 25 y.o.   MRN: [id number] Patient Care Team    Relationship Specialty Notifications Start End  Kathleen Hillock, DO PCP - General Family Medicine  06/03/20     Chief Complaint  Patient presents with  . Annual Exam    Pt is not fasting ;    Subjective:  Kathleen Robinson is a 25 y.o.  Female  present for CPE/cmc. All past medical history, surgical history, allergies, family history, immunizations, medications and social history were updated in the electronic medical record today. All recent labs, ED visits and hospitalizations within the last year were reviewed.  Health maintenance:  Colonoscopy: no fhx. Screen 43 Mammogram: no fhx, screen at 38 Cervical cancer screening: last pap: collected today w/ G/C/T completed by: PCP- rpt 2-3 yrs if normal.  Immunizations: tdap UTD 2017, Influenza UTD 2021 (encouraged yearly), HPV series completed, covid x3 Infectious disease screening: HIV and  Hep C collected today DEXA: screen at 58 Assistive device: none Oxygen TGY:BWLS Patient has a Dental home. Hospitalizations/ED visits: reviewed  Attention deficit hyperactivity disorder (ADHD), combined type Patient reports she was diagnosed with ADHD when she was young child.  She had started back on Adderall last year.  She does feel she needs to continue medications to help with her focus.  She feels the restart has been very helpful.   Severe recurrent major depression without psychotic features (HCC)/H/o Suicide attempt (HCC)/Anxiety and depression Pt reports she is doing well on paxil but does feel she could use a higher dose. She is feeling more  lack of motivation since going through a break up recently. She is grateful she gets to keep her dog and feels it has been helpful for her.  Prior note: Patient reports she has been diagnosed with depression, anxiety and mood disorder at the age of 50.  At that time she was tried on Zoloft and one other assess RI medication that did not work well for her.  Paxil was also tried at that time and worked great.  She states "it changed her life.  "She has sought out counseling as an adult and is currently attending counseling sessions with Kathleen Robinson at [facility].  She does have a history of suicide attempt in August 2019.  At that time she had just come back from [place] and had a traumatic experience in [place].  She reports when she did suicide she had taken pills.  She was admitted to the behavioral health unit.  She has had no suicidal ideations since that time.  She reports with her illness she can be straightforward and blunt.  She feels some people takes her as being rude but she does not necessarily mean her actions in a rude manner.  By her own words, she can be "vindictive.  "She recently quit her job.  She felt the manager was controlling and mean.  Patient states she quit the job right before shift so that there was not coverage. She is currently engaged as of 11/2019.  She reports her fianc is bipolar and has had a traumatic experience. paxil 30    Depression screen [facility] 2/9 09/09/2020 06/03/2020 01/29/2020 11/04/2019 08/15/2018  Decreased Interest  2 1 0 0 0  Down, Depressed, Hopeless 2 0 2 0 1  PHQ - 2 Score '4 1 2 ' 0 1  Altered sleeping 0 0 1 - 1  Tired, decreased energy '1 3 2 ' - 2  Change in appetite 2 1 0 - 0  Feeling bad or failure about yourself  0 0 1 - 1  Trouble concentrating '2 3 3 ' - 0  Moving slowly or fidgety/restless 0 2 1 - 0  Suicidal thoughts 0 0 0 - 0  PHQ-9 Score '9 10 10 ' - 5  Difficult doing work/chores - Somewhat difficult Somewhat difficult - Not difficult at all    GAD 7 : Generalized Anxiety Score 09/09/2020 01/29/2020 08/15/2018 06/18/2018  Nervous, Anxious, on Edge 0 1 0 1  Control/stop worrying 0 0 0 0  Worry too much - different things '1 1 1 1  ' Trouble relaxing 1 0 0 0  Restless 0 0 0 0  Easily annoyed or irritable '2 1 1 2  ' Afraid - awful might happen 0 0 0 0  Total GAD 7 Score '4 3 2 4  ' Anxiety Difficulty - Somewhat difficult Not difficult at all Very difficult     Immunization History  Administered Date(s) Administered  . DTaP 09/24/1993, 02/01/1994, 05/12/1994, 01/17/1995, 11/19/1998  . Hepatitis A 11/29/2010, 06/01/2011  . Hepatitis B [birth year]30[birth year]93, 09/24/1993, 03/04/1994  . HiB (PRP-OMP) 09/24/1993, 02/01/1994, 05/12/1994, 01/17/1995  . Hpv-Unspecified 10/10/2006, 12/12/2006, 06/18/2007  . IPV 09/24/1993, 02/01/1994, 05/12/1994, 11/19/1998  . Influenza-Unspecified 02/22/2012, 05/01/2014, 05/06/2015, 02/06/2019, 03/25/2020  . MMR 01/17/1995, 11/19/1998  . Meningococcal Conjugate 11/29/2010  . Meningococcal Polysaccharide 11/09/2009  . PFIZER(Purple Top)SARS-COV-2 Vaccination 09/10/2019, 10/01/2019, 04/25/2020  . Td 09/25/2007, 11/17/2015  . Tdap 11/17/2015  . Varicella 01/17/1995  . Yellow Fever 07/10/2013     Past Medical History:  Diagnosis Date  . Depression   . Postural dizziness with presyncope   . Shingles   . Suicide attempt ([place]) 2019   [facility]- admission. Took pills.    Allergies  Allergen Reactions  . Wasp Venom Swelling   Past Surgical History:  Procedure Laterality Date  . NO PAST SURGERIES     Family History  Problem Relation Age of Onset  . Diabetes Other   . Stroke Other   . Depression Mother   . Hyperlipidemia Father   . Depression Maternal Grandmother   . Alcohol abuse Maternal Grandmother   . Heart attack Maternal Grandfather   . Arthritis Paternal Grandmother   . Heart disease Paternal Grandmother   . Hypertension Paternal Grandmother   . Hyperlipidemia Paternal Grandmother   . Miscarriages /  Stillbirths Paternal Grandmother   . Mitral valve prolapse Paternal Grandmother   . Alcohol abuse Paternal Grandfather   . Depression Paternal Grandfather   . Heart disease Paternal Grandfather   . Hypertension Paternal Grandfather   . Hyperlipidemia Paternal Grandfather    Social History   Social History Narrative   Marital status/children/pets: Single   Education/employment: B.A. 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 If  you have any questions, ask your nurse or doctor.        amphetamine-dextroamphetamine 20 MG 24 hr capsule Commonly known as: Adderall XR Take 1 capsule (20 mg total) by mouth daily.   amphetamine-dextroamphetamine 20 MG 24 hr capsule Commonly known as: ADDERALL XR Take 1 capsule (20 mg total) by mouth every morning.   amphetamine-dextroamphetamine 20 MG 24 hr capsule Commonly known as: ADDERALL XR Take 1 capsule (20 mg total) by mouth every morning.   PARoxetine 40 MG tablet Commonly known as: PAXIL Take 1 tablet (40 mg total) by mouth daily. What changed:   medication strength  how much to take Changed by: Howard Pouch, DO       All past medical history, surgical history, allergies, family history, immunizations andmedications were updated in the EMR today and reviewed under the history and medication portions of their EMR.     No results found for this or any previous visit (from the past 2160 hour(s)).  DG Chest Portable 1 View  Result Date: 08/17/2018 CLINICAL DATA:  Chest pain EXAM: PORTABLE CHEST 1 VIEW COMPARISON:  None. FINDINGS: The heart size and mediastinal contours are within normal limits. Both lungs are clear. The visualized skeletal structures are  unremarkable. IMPRESSION: No active disease. Electronically Signed   By: Donavan Foil M.D.   On: 08/17/2018 23:02     ROS: 14 pt review of systems performed and negative (unless mentioned in an HPI)  Objective: BP 117/77   Pulse 73   Temp 98.1 F (36.7 C) (Oral)   Resp 16   Ht '5\' 5"'  (1.651 m)   Wt 153 lb 6.4 oz (69.6 kg)   SpO2 99%   BMI 25.53 kg/m  Gen: Afebrile. No acute distress. Nontoxic in appearance, well-developed, well-nourished,  Pleasant female.  HENT: AT. [place]. Bilateral TM visualized and normal in appearance, normal external auditory canal. MMM, no oral lesions, adequate dentition. Bilateral nares within normal limits. Throat without erythema, ulcerations or exudates. no Cough on exam, no hoarseness on exam. Eyes:Pupils Equal Round Reactive to light, Extraocular movements intact,  Conjunctiva without redness, discharge or icterus. Neck/lymp/endocrine: Supple,no lymphadenopathy, no thyromegaly CV: RRR no murmur, no edema, +2/4 P posterior tibialis pulses.  Chest: CTAB, no wheeze, rhonchi or crackles. normal Respiratory effort. good Air movement. Abd: Soft. flat. NTND. BS present. no Masses palpated. No hepatosplenomegaly. No rebound tenderness or guarding. Skin: no rashes, purpura or petechiae. Warm and well-perfused. Skin intact. Neuro/Msk:  Normal gait. PERLA. EOMi. Alert. Oriented x3.  Cranial nerves II through XII intact. Muscle strength 5/5 upper/lower extremity. DTRs equal bilaterally. Psych: Normal affect, dress and demeanor. Normal speech. Normal thought content and judgment. Breasts: breasts appear normal, symmetrical, no tenderness on exam, no suspicious masses, no skin or nipple changes or axillary nodes. GYN:  External genitalia within normal limits, normal hair distribution, no lesions. Urethral meatus normal, no lesions. Vaginal mucosa pink, moist, normal rugae, no lesions. No cystocele or rectocele. cervix without lesions, mild greeenish discharge. Bimanual exam  revealed normal uterus.  No bladder/suprapubic fullness, masses or tenderness. No cervical motion tenderness. No adnexal fullness. Anus and perineum within normal limits, no lesions.  No exam data present  Assessment/plan: Kathleen Robinson is a 25 y.o. female present for CPE/CMC/ PAP Attention deficit hyperactivity disorder (ADHD), combined type Stable. Continue Adderall 20 mg daily [place] controlled substance database reviewed today and appropriate UDS up-to-date Contract signed today Follow-up 2.5 months  Severe recurrent major depression without psychotic features (HCC)/H/o Suicide  attempt (HCC)/Anxiety and depression Overall stable, but could benefit from increased dose after recent separation. Increase Paxil to 40 mg daily. Follow-up 5.5 months, sooner if needed  Depo-Provera contraceptive status No longer desires.  Cervical cancer screening - Cytology - PAP( [place]) with G/C/T screening Encounter for screening for HIV HIV screening collected today Need for hepatitis C screening test Hepatitis C screen collected today  Routine general medical examination at a health care facility Colonoscopy: no fhx. Screen 43 Mammogram: no fhx, screen at 38 Cervical cancer screening: last pap: collected today w/ G/C/T completed by: PCP- rpt 2-3 yrs if normal.  Immunizations: tdap UTD 2017, Influenza UTD 2021 (encouraged yearly), HPV series completed, covid x3 Infectious disease screening: HIV and  Hep C collected today DEXA: screen at 58 Patient was encouraged to exercise greater than 150 minutes a week. Patient was encouraged to choose a diet filled with fresh fruits and vegetables, and lean meats. AVS provided to patient today for education/recommendation on gender specific health and safety maintenance.  Return in about 2 months (around 11/23/2020) for [place] (30 min).  Orders Placed This Encounter  Procedures  . CBC with Differential/Platelet  . Comprehensive metabolic  panel  . Hemoglobin A1c  . Lipid panel  . Hepatitis C antibody  . HIV antibody (with reflex)   Meds ordered this encounter  Medications  . DISCONTD: PARoxetine (PAXIL) 30 MG tablet    Sig: Take 1 tablet (30 mg total) by mouth daily.    Dispense:  90 tablet    Refill:  1    Please DC other scripts  . PARoxetine (PAXIL) 40 MG tablet    Sig: Take 1 tablet (40 mg total) by mouth daily.    Dispense:  90 tablet    Refill:  1    Please DC other scripts  . amphetamine-dextroamphetamine (ADDERALL XR) 20 MG 24 hr capsule    Sig: Take 1 capsule (20 mg total) by mouth daily.    Dispense:  30 capsule    Refill:  0  . amphetamine-dextroamphetamine (ADDERALL XR) 20 MG 24 hr capsule    Sig: Take 1 capsule (20 mg total) by mouth every morning.    Dispense:  30 capsule    Refill:  0    May fill ~60 days after date on prescription.  Marland Kitchen amphetamine-dextroamphetamine (ADDERALL XR) 20 MG 24 hr capsule    Sig: Take 1 capsule (20 mg total) by mouth every morning.    Dispense:  30 capsule    Refill:  0    May fill ~30 days after prescription date   Referral Orders  No referral(s) requested today     Electronically signed by: Howard Pouch, [place]

## 2020-09-09 NOTE — Patient Instructions (Signed)
Great to see you today.  I have refilled the medication(s) we provide.   If labs were collected, we will inform you of lab results once received either by echart message or telephone call.   - echart message- for normal results that have been seen by the patient already.   - telephone call: abnormal results or if patient has not viewed results in their echart.    Health Maintenance, Female Adopting a healthy lifestyle and getting preventive care are important in promoting health and wellness. Ask your health care provider about:  The right schedule for you to have regular tests and exams.  Things you can do on your own to prevent diseases and keep yourself healthy. What should I know about diet, weight, and exercise? Eat a healthy diet  Eat a diet that includes plenty of vegetables, fruits, low-fat dairy products, and lean protein.  Do not eat a lot of foods that are high in solid fats, added sugars, or sodium.   Maintain a healthy weight Body mass index (BMI) is used to identify weight problems. It estimates body fat based on height and weight. Your health care provider can help determine your BMI and help you achieve or maintain a healthy weight. Get regular exercise Get regular exercise. This is one of the most important things you can do for your health. Most adults should:  Exercise for at least 150 minutes each week. The exercise should increase your heart rate and make you sweat (moderate-intensity exercise).  Do strengthening exercises at least twice a week. This is in addition to the moderate-intensity exercise.  Spend less time sitting. Even light physical activity can be beneficial. Watch cholesterol and blood lipids Have your blood tested for lipids and cholesterol at 27 years of age, then have this test every 5 years. Have your cholesterol levels checked more often if:  Your lipid or cholesterol levels are high.  You are older than 27 years of age.  You are at  high risk for heart disease. What should I know about cancer screening? Depending on your health history and family history, you may need to have cancer screening at various ages. This may include screening for:  Breast cancer.  Cervical cancer.  Colorectal cancer.  Skin cancer.  Lung cancer. What should I know about heart disease, diabetes, and high blood pressure? Blood pressure and heart disease  High blood pressure causes heart disease and increases the risk of stroke. This is more likely to develop in people who have high blood pressure readings, are of African descent, or are overweight.  Have your blood pressure checked: ? Every 3-5 years if you are 18-39 years of age. ? Every year if you are 40 years old or older. Diabetes Have regular diabetes screenings. This checks your fasting blood sugar level. Have the screening done:  Once every three years after age 40 if you are at a normal weight and have a low risk for diabetes.  More often and at a younger age if you are overweight or have a high risk for diabetes. What should I know about preventing infection? Hepatitis B If you have a higher risk for hepatitis B, you should be screened for this virus. Talk with your health care provider to find out if you are at risk for hepatitis B infection. Hepatitis C Testing is recommended for:  Everyone born from 1945 through 1965.  Anyone with known risk factors for hepatitis C. Sexually transmitted infections (STIs)  Get screened for   STIs, including gonorrhea and chlamydia, if: ? You are sexually active and are younger than 27 years of age. ? You are older than 27 years of age and your health care provider tells you that you are at risk for this type of infection. ? Your sexual activity has changed since you were last screened, and you are at increased risk for chlamydia or gonorrhea. Ask your health care provider if you are at risk.  Ask your health care provider about whether  you are at high risk for HIV. Your health care provider may recommend a prescription medicine to help prevent HIV infection. If you choose to take medicine to prevent HIV, you should first get tested for HIV. You should then be tested every 3 months for as long as you are taking the medicine. Pregnancy  If you are about to stop having your period (premenopausal) and you may become pregnant, seek counseling before you get pregnant.  Take 400 to 800 micrograms (mcg) of folic acid every day if you become pregnant.  Ask for birth control (contraception) if you want to prevent pregnancy. Osteoporosis and menopause Osteoporosis is a disease in which the bones lose minerals and strength with aging. This can result in bone fractures. If you are 65 years old or older, or if you are at risk for osteoporosis and fractures, ask your health care provider if you should:  Be screened for bone loss.  Take a calcium or vitamin D supplement to lower your risk of fractures.  Be given hormone replacement therapy (HRT) to treat symptoms of menopause. Follow these instructions at home: Lifestyle  Do not use any products that contain nicotine or tobacco, such as cigarettes, e-cigarettes, and chewing tobacco. If you need help quitting, ask your health care provider.  Do not use street drugs.  Do not share needles.  Ask your health care provider for help if you need support or information about quitting drugs. Alcohol use  Do not drink alcohol if: ? Your health care provider tells you not to drink. ? You are pregnant, may be pregnant, or are planning to become pregnant.  If you drink alcohol: ? Limit how much you use to 0-1 drink a day. ? Limit intake if you are breastfeeding.  Be aware of how much alcohol is in your drink. In the [place] one drink equals one 12 oz bottle of beer (355 mL), one 5 oz glass of wine (148 mL), or one 1 oz glass of hard liquor (44 mL). General instructions  Schedule regular  health, dental, and eye exams.  Stay current with your vaccines.  Tell your health care provider if: ? You often feel depressed. ? You have ever been abused or do not feel safe at home. Summary  Adopting a healthy lifestyle and getting preventive care are important in promoting health and wellness.  Follow your health care provider's instructions about healthy diet, exercising, and getting tested or screened for diseases.  Follow your health care provider's instructions on monitoring your cholesterol and blood pressure. This information is not intended to replace advice given to you by your health care provider. Make sure you discuss any questions you have with your health care provider. Document Revised: 04/04/2018 Document Reviewed: 04/04/2018 Elsevier Patient Education  2021 Elsevier Inc.  

## 2020-09-10 LAB — LIPID PANEL
Cholesterol: 206 mg/dL — ABNORMAL HIGH (ref <200)
HDL: 63 mg/dL (ref >=50)
LDL Cholesterol (Calc): 113 mg/dL (calc) — ABNORMAL HIGH
Non-HDL Cholesterol (Calc): 143 mg/dL (calc) — ABNORMAL HIGH (ref <130)
Total CHOL/HDL Ratio: 3.3 (calc) (ref <5.0)
Triglycerides: 178 mg/dL — ABNORMAL HIGH (ref <150)

## 2020-09-10 LAB — COMPREHENSIVE METABOLIC PANEL
AG Ratio: 1.6 (calc) (ref 1.0–2.5)
ALT: 5 U/L — ABNORMAL LOW (ref 6–29)
AST: 15 U/L (ref 10–30)
Albumin: 4.6 g/dL (ref 3.6–5.1)
Alkaline phosphatase (APISO): 75 U/L (ref 31–125)
BUN: 12 mg/dL (ref 7–25)
CO2: 22 mmol/L (ref 20–32)
Calcium: 9.5 mg/dL (ref 8.6–10.2)
Chloride: 104 mmol/L (ref 98–110)
Creat: 0.87 mg/dL (ref 0.50–1.10)
Globulin: 2.8 g/dL (calc) (ref 1.9–3.7)
Glucose, Bld: 62 mg/dL — ABNORMAL LOW (ref 65–99)
Potassium: 3.9 mmol/L (ref 3.5–5.3)
Sodium: 138 mmol/L (ref 135–146)
Total Bilirubin: 0.6 mg/dL (ref 0.2–1.2)
Total Protein: 7.4 g/dL (ref 6.1–8.1)

## 2020-09-10 LAB — CBC WITH DIFFERENTIAL/PLATELET
Absolute Monocytes: 503 cells/uL (ref 200–950)
Basophils Absolute: 61 cells/uL (ref 0–200)
Basophils Relative: 0.9 %
Eosinophils Absolute: 170 cells/uL (ref 15–500)
Eosinophils Relative: 2.5 %
HCT: 41.2 % (ref 35.0–45.0)
Hemoglobin: 13.7 g/dL (ref 11.7–15.5)
Lymphs Abs: 1673 cells/uL (ref 850–3900)
MCH: 27.3 pg (ref 27.0–33.0)
MCHC: 33.3 g/dL (ref 32.0–36.0)
MCV: 82.2 fL (ref 80.0–100.0)
MPV: 10.7 fL (ref 7.5–12.5)
Monocytes Relative: 7.4 %
Neutro Abs: 4393 cells/uL (ref 1500–7800)
Neutrophils Relative %: 64.6 %
Platelets: 339 10*3/uL (ref 140–400)
RBC: 5.01 10*6/uL (ref 3.80–5.10)
RDW: 12.6 % (ref 11.0–15.0)
Total Lymphocyte: 24.6 %
WBC: 6.8 10*3/uL (ref 3.8–10.8)

## 2020-09-10 LAB — HEPATITIS C ANTIBODY
Hepatitis C Ab: NONREACTIVE
SIGNAL TO CUT-OFF: 0.03 (ref <1.00)

## 2020-09-10 LAB — HEMOGLOBIN A1C
Hgb A1c MFr Bld: 5 % of total Hgb (ref <5.7)
Mean Plasma Glucose: 97 mg/dL
eAG (mmol/L): 5.4 mmol/L

## 2020-09-10 LAB — HIV ANTIBODY (ROUTINE TESTING W REFLEX): HIV 1&2 Ab, 4th Generation: NONREACTIVE

## 2020-09-15 DIAGNOSIS — F332 Major depressive disorder, recurrent severe without psychotic features: Secondary | ICD-10-CM | POA: Diagnosis not present

## 2020-09-15 DIAGNOSIS — F4312 Post-traumatic stress disorder, chronic: Secondary | ICD-10-CM | POA: Diagnosis not present

## 2020-09-15 LAB — CYTOLOGY - PAP
Chlamydia: NEGATIVE
Comment: NEGATIVE
Comment: NEGATIVE
Comment: NORMAL
Diagnosis: NEGATIVE
Diagnosis: REACTIVE
Neisseria Gonorrhea: NEGATIVE
Trichomonas: NEGATIVE

## 2020-09-22 ENCOUNTER — Encounter: Payer: Self-pay | Admitting: Family Medicine

## 2020-09-22 NOTE — Telephone Encounter (Signed)
Form placed on desk.

## 2020-09-23 NOTE — Telephone Encounter (Signed)
Form signed and placed in CMA work basket to complete

## 2020-09-29 DIAGNOSIS — F4312 Post-traumatic stress disorder, chronic: Secondary | ICD-10-CM | POA: Diagnosis not present

## 2020-09-29 DIAGNOSIS — F332 Major depressive disorder, recurrent severe without psychotic features: Secondary | ICD-10-CM | POA: Diagnosis not present

## 2020-10-06 ENCOUNTER — Other Ambulatory Visit: Payer: Self-pay | Admitting: Family Medicine

## 2020-10-06 DIAGNOSIS — F332 Major depressive disorder, recurrent severe without psychotic features: Secondary | ICD-10-CM

## 2020-10-09 ENCOUNTER — Other Ambulatory Visit: Payer: Self-pay

## 2020-10-09 DIAGNOSIS — F332 Major depressive disorder, recurrent severe without psychotic features: Secondary | ICD-10-CM

## 2020-10-09 MED ORDER — PAROXETINE HCL 40 MG PO TABS: 40.0000 mg | ORAL_TABLET | Freq: Every day | ORAL | 1 refills | Status: DC

## 2020-10-28 DIAGNOSIS — F4312 Post-traumatic stress disorder, chronic: Secondary | ICD-10-CM | POA: Diagnosis not present

## 2020-10-28 DIAGNOSIS — F332 Major depressive disorder, recurrent severe without psychotic features: Secondary | ICD-10-CM | POA: Diagnosis not present

## 2020-11-10 DIAGNOSIS — F332 Major depressive disorder, recurrent severe without psychotic features: Secondary | ICD-10-CM | POA: Diagnosis not present

## 2020-11-10 DIAGNOSIS — F4312 Post-traumatic stress disorder, chronic: Secondary | ICD-10-CM | POA: Diagnosis not present

## 2020-12-01 DIAGNOSIS — F332 Major depressive disorder, recurrent severe without psychotic features: Secondary | ICD-10-CM | POA: Diagnosis not present

## 2020-12-01 DIAGNOSIS — F4312 Post-traumatic stress disorder, chronic: Secondary | ICD-10-CM | POA: Diagnosis not present

## 2020-12-15 DIAGNOSIS — F332 Major depressive disorder, recurrent severe without psychotic features: Secondary | ICD-10-CM | POA: Diagnosis not present

## 2020-12-15 DIAGNOSIS — F4312 Post-traumatic stress disorder, chronic: Secondary | ICD-10-CM | POA: Diagnosis not present

## 2020-12-23 ENCOUNTER — Telehealth: Payer: Self-pay

## 2020-12-23 NOTE — Telephone Encounter (Signed)
Patient refill request.  Pharmacy that she normally uses and get this med filled is out of stock.  I asked patient to call other pharmacies in her area to see if med is in stock . She will call back and let us know what pharmacy to send prescription to.  Patient can be reached at [phone].   amphetamine-dextroamphetamine (ADDERALL XR) 20 MG 24 hr capsule [[id number]]

## 2021-01-12 NOTE — Telephone Encounter (Signed)
Spoke with pt to sched for an appt. Pt declined next avail 10/12 due to already being out of meds and appt was a month out. Offered pt to be added onto the waitlist in case a sooner appt becomes available. Pt declined being added to waitlist due to job and limited availability. Pt asked if she can speak to Dr. Claiborne Billings since she do not feel she needs a follow up appt since she was just seen in May. Pt was informed that Dr. Claiborne Billings is in clinic and unable to talk and I am here to talk to her on Dr. Alan Ripper behalf. Pt was informed during last visit she was told to sched a 2 mo f/u and that due to her medication being a controlled substance she is required by law to have her f/u every so 90 days. Pt was offered to speak to clinical supervisor pt happily agreed.  Recommended that pt to sched f/u appt in future when notices medication is low to prevent running out in the future.   Please contact pt regarding message above.  Thanks

## 2021-01-12 NOTE — Telephone Encounter (Signed)
Patient had a total of 3 prescriptions for her Adderall called in and she was seen 09/09/2020.  This is a controlled substance.  Patient must be seen face-to-face every 3 months in order to have refills.  Patient was made aware of this policy during her appointment and she signed a controlled substance contract.  By law, no refills will be provided until patient is seen.  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

## 2021-01-12 NOTE — Telephone Encounter (Signed)
Pt called stating she has been out for 3 weeks. [facility] has back in stock. Pt requesting RX be sent in for her asap so they don't run out again.   [facility]

## 2021-01-12 NOTE — Telephone Encounter (Signed)
Pt will like to have VV for med refill appt. Please advise if okay to sched as VV?  Contract UTD

## 2021-01-13 NOTE — Telephone Encounter (Addendum)
User error on previous note from 9/21. Received phone call from patient at 4:55PM and was not able to document call due to timeframe.   When I answered call, patient said "Please remove me as a patient from your practice and with Dr. Claiborne Billings. I will not be coming back there again your office does not care about your patients! "  I asked for her name and DOB.  She stated she was just speaking to our clinical superviser; patient was yelling and crying.  She stated she has been trying to get her meds for several weeks now, and has been without her meds for at least 3 weeks now. She then stated "and you can piss off and go to hell." And hung up the phone with me.

## 2021-01-13 NOTE — Telephone Encounter (Signed)
Patient called on 01/12/21

## 2021-01-13 NOTE — Telephone Encounter (Signed)
Called pt back to finish informing her of providers instructions. Per PCP yesterday evening she stated that we can sched pt in 1130 slot next Wed. I asked pt if she was still wanting to remove Dr. Claiborne Billings as PCP. Pt stated she just wanted to be seen and felt she wasn't given any options and stated "the clinical supervisor was no help" which caused her to be more frustrated. Pt agreed to appt and apologized for being rude and that it has been extremely hard for her to manage things without her medication.   I apologized to pt for any frustrations we may caused to contribute to her stress. Recommend to pt that she should call in the future when she notices her medication is running low. I then informed that Dr. Claiborne Billings is doing a VV this time but in future she will need a face to face visit every 3 mos.   Note: Clinic never received the first message pt called 2 weeks ago in regards to needing medication refill.

## 2021-01-20 ENCOUNTER — Telehealth (INDEPENDENT_AMBULATORY_CARE_PROVIDER_SITE_OTHER): Payer: BC Managed Care – PPO | Admitting: Family Medicine

## 2021-01-20 ENCOUNTER — Encounter: Payer: Self-pay | Admitting: Family Medicine

## 2021-01-20 DIAGNOSIS — F332 Major depressive disorder, recurrent severe without psychotic features: Secondary | ICD-10-CM

## 2021-01-20 DIAGNOSIS — F902 Attention-deficit hyperactivity disorder, combined type: Secondary | ICD-10-CM

## 2021-01-20 DIAGNOSIS — F419 Anxiety disorder, unspecified: Secondary | ICD-10-CM

## 2021-01-20 DIAGNOSIS — F32A Depression, unspecified: Secondary | ICD-10-CM | POA: Diagnosis not present

## 2021-01-20 MED ORDER — PAROXETINE HCL 40 MG PO TABS: 40.0000 mg | ORAL_TABLET | Freq: Every day | ORAL | 1 refills | Status: DC

## 2021-01-20 MED ORDER — AMPHETAMINE-DEXTROAMPHET ER 20 MG PO CP24
20.0000 mg | ORAL_CAPSULE | Freq: Every day | ORAL | 0 refills | Status: DC
Start: 2021-01-20 — End: 2021-04-13

## 2021-01-20 MED ORDER — AMPHETAMINE-DEXTROAMPHET ER 20 MG PO CP24: 20.0000 mg | ORAL_CAPSULE | ORAL | 0 refills | Status: DC

## 2021-01-20 MED ORDER — AMPHETAMINE-DEXTROAMPHET ER 20 MG PO CP24
20.0000 mg | ORAL_CAPSULE | ORAL | 0 refills | Status: DC
Start: 2021-01-20 — End: 2021-04-13

## 2021-01-20 NOTE — Patient Instructions (Signed)
  Great to see you today.  I have refilled the medication(s) we provide.   If labs were collected, we will inform you of lab results once received either by echart message or telephone call.   - echart message- for normal results that have been seen by the patient already.   - telephone call: abnormal results or if patient has not viewed results in their echart.  Follow-up in 2.5 months

## 2021-01-20 NOTE — Progress Notes (Signed)
This visit occurred during the SARS-CoV-2 public health emergency.  Safety protocols were in place, including screening questions prior to the visit, additional usage of staff PPE, and extensive cleaning of exam room while observing appropriate contact time as indicated for disinfecting solutions.    Patient ID: Kathleen Robinson, female  DOB: 30-Apr-1995, 25 y.o.   MRN: [id number] Patient Care Team    Relationship Specialty Notifications Start End  Ma Hillock, DO PCP - General Family Medicine  06/03/20     Chief Complaint  Patient presents with   ADHD    [facility];     Subjective:  Kathleen Robinson is a 25 y.o.  Female  present for [facility] Attention deficit hyperactivity disorder (ADHD), combined type Patient reports she was diagnosed with ADHD when she was young child.  She reports she would like to continue her ADHD medicine of Adderall 20 mg XR daily.  She has been out of this medicine for a few days and has struggled to get through her workday.  Patient understands that this is a controlled substance and she will need to be seen every 2.5 months in order not to run out of prescriptions.  Her contract is up-to-date.   Severe recurrent major depression without psychotic features (HCC)/H/o Suicide attempt (HCC)/Anxiety and depression Pt reports she is doing very well on paxil 40 mg daily.  This was increased last appointment and she states she has noticed it has been helpful.  She has been going through a lot of changes this year life wise.  Including a break-up with a long-term significant other.  Prior note: Patient reports she has been diagnosed with depression, anxiety and mood disorder at the age of 54.  At that time she was tried on Zoloft and one other assess RI medication that did not work well for her.  Paxil was also tried at that time and worked great.  She states "it changed her life.  "She has sought out counseling as an adult and is currently attending counseling sessions with Hildred Alamin at  [facility].  She does have a history of suicide attempt in August 2019.  At that time she had just come back from [place] and had a traumatic experience in [place].  She reports when she did suicide she had taken pills.  She was admitted to the behavioral health unit.  She has had no suicidal ideations since that time.  She reports with her illness she can be straightforward and blunt.  She feels some people takes her as being rude but she does not necessarily mean her actions in a rude manner.  By her own words, she can be "vindictive.  "She recently quit her job.  She felt the manager was controlling and mean.  Patient states she quit the job right before shift so that there was not coverage. She is currently engaged as of 11/2019.  She reports her fianc is bipolar and has had a traumatic experience. paxil 30     Depression screen [facility] 2/9 01/20/2021 09/09/2020 06/03/2020 01/29/2020 11/04/2019  Decreased Interest '3 2 1 ' 0 0  Down, Depressed, Hopeless 1 2 0 2 0  PHQ - 2 Score '4 4 1 2 ' 0  Altered sleeping 2 0 0 1 -  Tired, decreased energy '3 1 3 2 ' -  Change in appetite '1 2 1 ' 0 -  Feeling bad or failure about yourself  2 0 0 1 -  Trouble concentrating '3 2 3 3 ' -  Moving  slowly or fidgety/restless 0 0 2 1 -  Suicidal thoughts 1 0 0 0 -  PHQ-9 Score '16 9 10 10 ' -  Difficult doing work/chores - - Somewhat difficult Somewhat difficult -   GAD 7 : Generalized Anxiety Score 01/20/2021 09/09/2020 01/29/2020 08/15/2018  Nervous, Anxious, on Edge 1 0 1 0  Control/stop worrying 0 0 0 0  Worry too much - different things '2 1 1 1  ' Trouble relaxing 2 1 0 0  Restless 0 0 0 0  Easily annoyed or irritable '1 2 1 1  ' Afraid - awful might happen 0 0 0 0  Total GAD 7 Score '6 4 3 2  ' Anxiety Difficulty - - Somewhat difficult Not difficult at all     Immunization History  Administered Date(s) Administered   DTaP 09/24/1993, 02/01/1994, 05/12/1994, 01/17/1995, 11/19/1998   Hepatitis A 11/29/2010,  06/01/2011   Hepatitis B 1994/04/12, 09/24/1993, 03/04/1994   HiB (PRP-OMP) 09/24/1993, 02/01/1994, 05/12/1994, 01/17/1995   Hpv-Unspecified 10/10/2006, 12/12/2006, 06/18/2007   IPV 09/24/1993, 02/01/1994, 05/12/1994, 11/19/1998   Influenza-Unspecified 02/22/2012, 05/01/2014, 05/06/2015, 02/06/2019, 03/25/2020   MMR 01/17/1995, 11/19/1998   Meningococcal Conjugate 11/29/2010   Meningococcal Polysaccharide 11/09/2009   PFIZER(Purple Top)SARS-COV-2 Vaccination 09/10/2019, 10/01/2019, 04/25/2020   Td 09/25/2007, 11/17/2015   Tdap 11/17/2015   Varicella 01/17/1995   Yellow Fever 07/10/2013     Past Medical History:  Diagnosis Date   Depression    Postural dizziness with presyncope    Shingles    Suicide attempt ([place]) 2019   [facility]- admission. Took pills.    Allergies  Allergen Reactions   Wasp Venom Swelling   Past Surgical History:  Procedure Laterality Date   NO PAST SURGERIES     Family History  Problem Relation Age of Onset   Diabetes Other    Stroke Other    Depression Mother    Hyperlipidemia Father    Depression Maternal Grandmother    Alcohol abuse Maternal Grandmother    Heart attack Maternal Grandfather    Arthritis Paternal Grandmother    Heart disease Paternal Grandmother    Hypertension Paternal Grandmother    Hyperlipidemia Paternal Grandmother    Miscarriages / Stillbirths Paternal Grandmother    Mitral valve prolapse Paternal Grandmother    Alcohol abuse Paternal Grandfather    Depression Paternal Grandfather    Heart disease Paternal Grandfather    Hypertension Paternal Grandfather    Hyperlipidemia Paternal Grandfather    Social History   Social History Narrative   Marital status/children/pets: Single   Education/employment: B.A. , Deposit rep   Safety:      -Wears a bicycle helmet riding a bike: Yes     -smoke alarm in the home:Yes     - wears seatbelt: Yes     - Feels safe in their relationships: Yes    Allergies as of 01/20/2021        Reactions   Wasp Venom Swelling        Medication List        Accurate as of January 20, 2021 11:03 AM. If you have any questions, ask your nurse or doctor.          amphetamine-dextroamphetamine 20 MG 24 hr capsule Commonly known as: Adderall XR Take 1 capsule (20 mg total) by mouth daily.   amphetamine-dextroamphetamine 20 MG 24 hr capsule Commonly known as: ADDERALL XR Take 1 capsule (20 mg total) by mouth every morning.   amphetamine-dextroamphetamine 20 MG 24 hr capsule Commonly known as: ADDERALL XR  Take 1 capsule (20 mg total) by mouth every morning.   PARoxetine 40 MG tablet Commonly known as: PAXIL Take 1 tablet (40 mg total) by mouth daily.        All past medical history, surgical history, allergies, family history, immunizations andmedications were updated in the EMR today and reviewed under the history and medication portions of their EMR.     No results found for this or any previous visit (from the past 2160 hour(s)).     ROS: 14 pt review of systems performed and negative (unless mentioned in an HPI)  Objective: There were no vitals taken for this visit. Gen: Afebrile. No acute distress.  Nontoxic, very pleasant HENT: AT. .  Eyes:Pupils Equal Round Reactive to light, Extraocular movements intact,  Conjunctiva without redness, discharge or icterus. Neuro:  Alert. Oriented x3 Psych: Normal affect, dress and demeanor. Normal speech. Normal thought content and judgment.   No results found.  Assessment/plan: Kathleen Robinson is a 25 y.o. female present for CPE/CMC/ PAP Attention deficit hyperactivity disorder (ADHD), combined type Stable.   Continue Adderall 20 mg daily [place] controlled substance database reviewed today and appropriate UDS up-to-date Contract signed today Follow-up 2.5 months face-to-face.  Can be virtual if patient desires   Severe recurrent major depression without psychotic features (HCC)/H/o Suicide attempt  (HCC)/Anxiety and depression Improved with increased doses of Paxil last visit Continue Paxil to 40 mg daily. Follow-up 5.5 months, sooner if needed   Return in about 2 months (around 04/05/2021) for CMC (30 min).  No orders of the defined types were placed in this encounter.  Meds ordered this encounter  Medications   PARoxetine (PAXIL) 40 MG tablet    Sig: Take 1 tablet (40 mg total) by mouth daily.    Dispense:  90 tablet    Refill:  1   amphetamine-dextroamphetamine (ADDERALL XR) 20 MG 24 hr capsule    Sig: Take 1 capsule (20 mg total) by mouth daily.    Dispense:  30 capsule    Refill:  0   amphetamine-dextroamphetamine (ADDERALL XR) 20 MG 24 hr capsule    Sig: Take 1 capsule (20 mg total) by mouth every morning.    Dispense:  30 capsule    Refill:  0    May fill ~60 days after date on prescription.   amphetamine-dextroamphetamine (ADDERALL XR) 20 MG 24 hr capsule    Sig: Take 1 capsule (20 mg total) by mouth every morning.    Dispense:  30 capsule    Refill:  0    May fill ~30 days after prescription date    Referral Orders  No referral(s) requested today     Electronically signed by: Howard Pouch, [place]

## 2021-03-04 IMAGING — DX PORTABLE CHEST - 1 VIEW
2 series · 2 of 2 positions shown · non-contrast
Comparison: None.

CLINICAL DATA: Chest pain

EXAM:
PORTABLE CHEST 1 VIEW

[chest ap (1 of 2)]
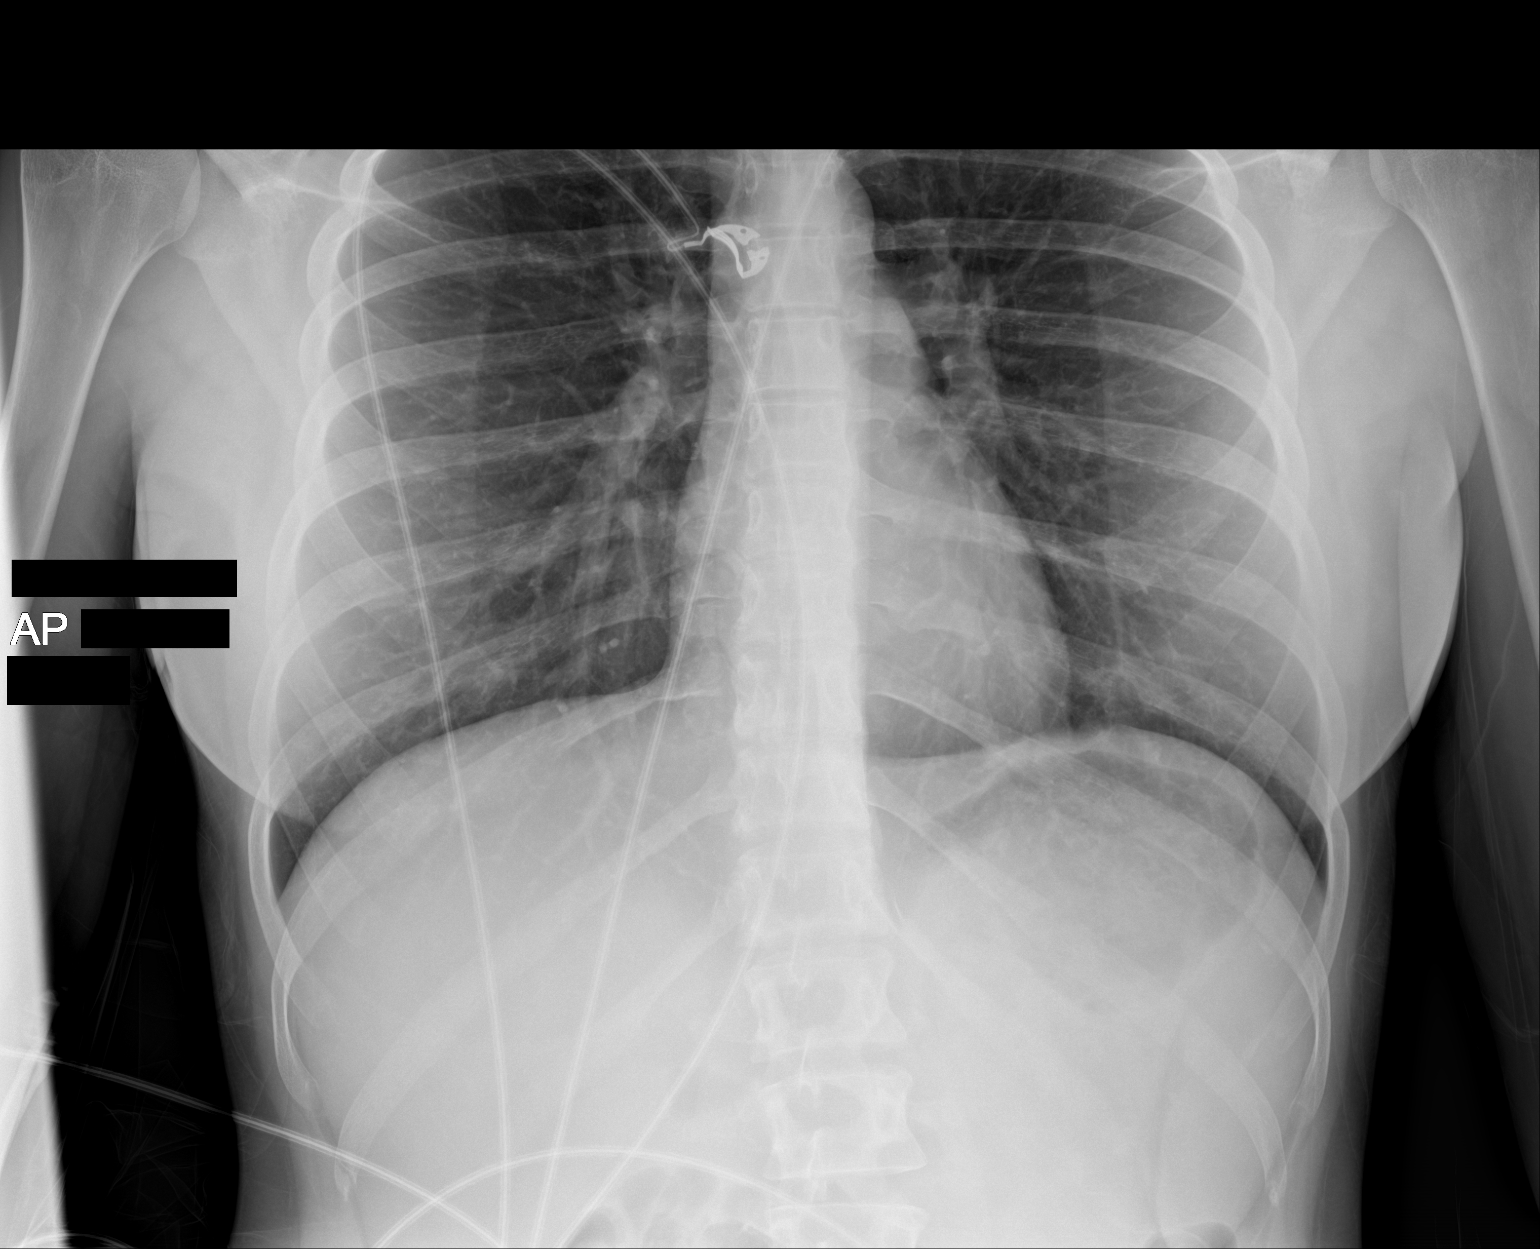

[chest ap (2 of 2)]
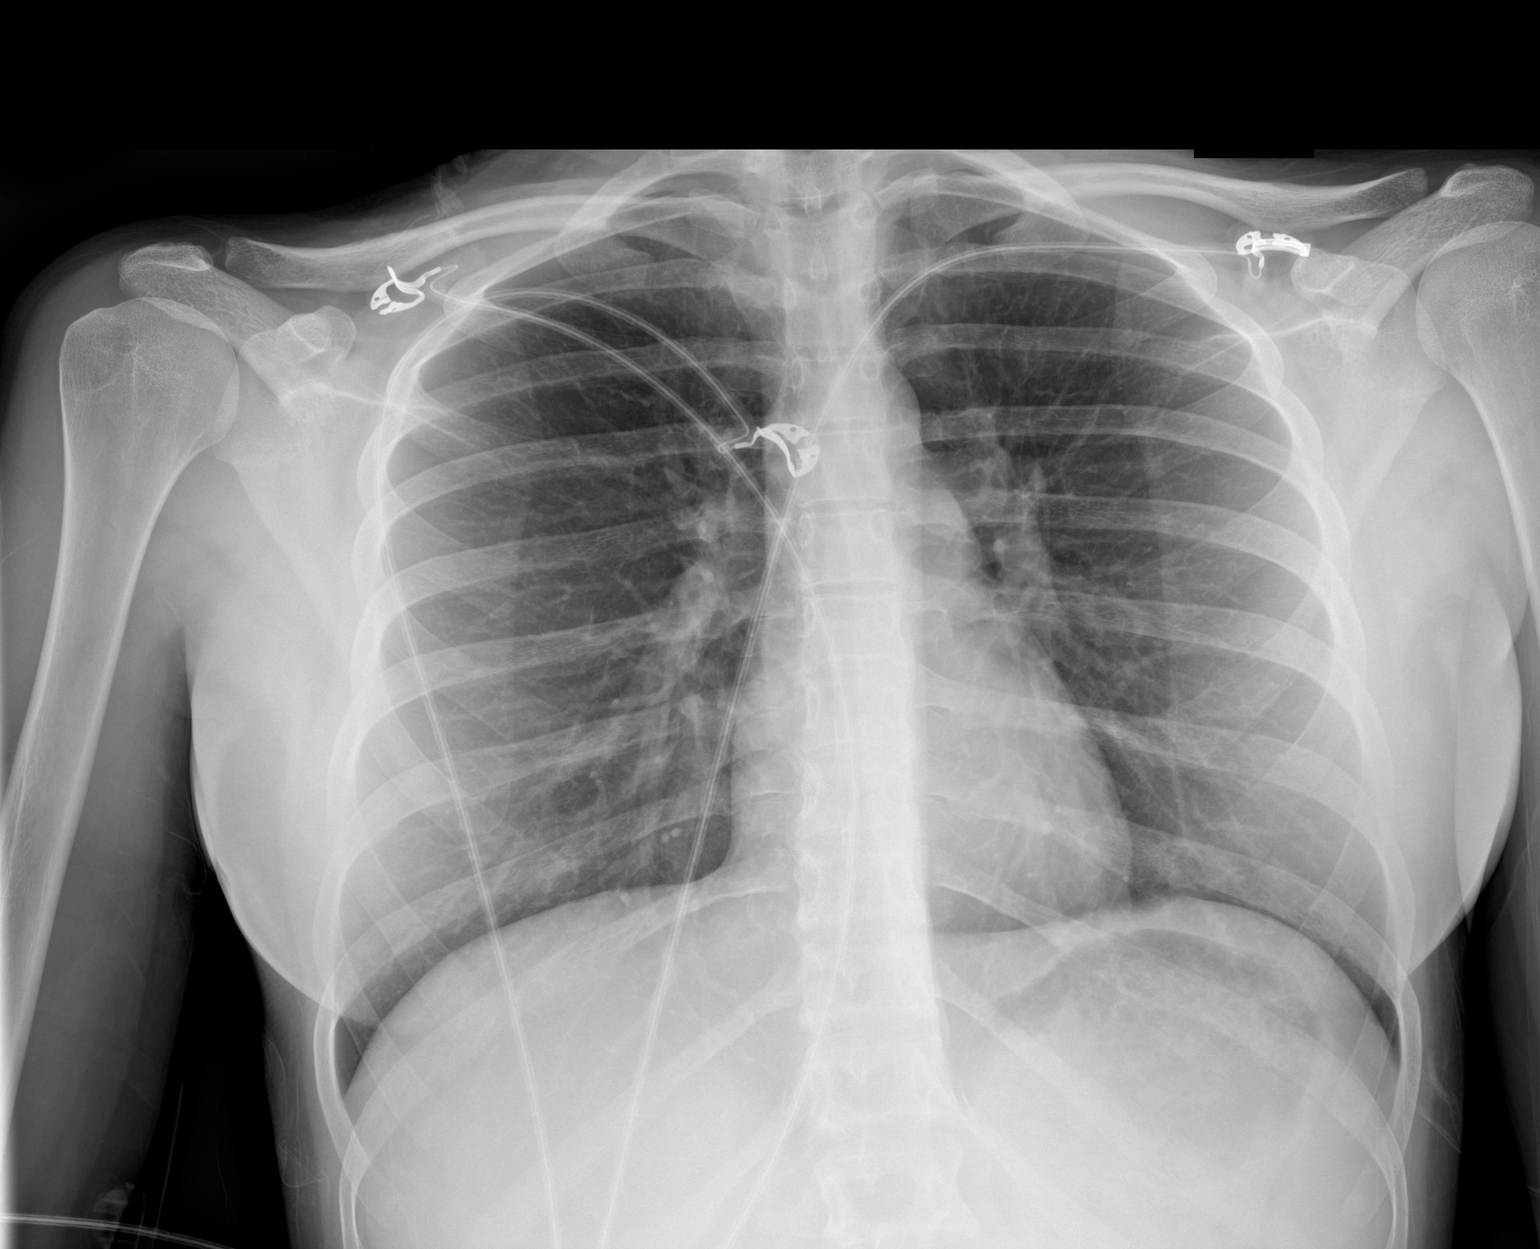

[2 of 2 positions shown; findings below may reference images not displayed]

FINDINGS: The heart size and mediastinal contours are within normal limits.
Both lungs are clear. The visualized skeletal structures are
unremarkable.
IMPRESSION: No active disease.

## 2021-04-13 ENCOUNTER — Encounter: Payer: Self-pay | Admitting: Family Medicine

## 2021-04-13 ENCOUNTER — Ambulatory Visit: Payer: BC Managed Care – PPO | Admitting: Family Medicine

## 2021-04-13 ENCOUNTER — Other Ambulatory Visit: Payer: Self-pay

## 2021-04-13 VITALS — BP 128/79 | HR 96 | Temp 98.9°F | Ht 65.0 in | Wt 168.0 lb

## 2021-04-13 DIAGNOSIS — F32A Depression, unspecified: Secondary | ICD-10-CM | POA: Diagnosis not present

## 2021-04-13 DIAGNOSIS — F332 Major depressive disorder, recurrent severe without psychotic features: Secondary | ICD-10-CM | POA: Diagnosis not present

## 2021-04-13 DIAGNOSIS — F419 Anxiety disorder, unspecified: Secondary | ICD-10-CM

## 2021-04-13 DIAGNOSIS — Z23 Encounter for immunization: Secondary | ICD-10-CM | POA: Diagnosis not present

## 2021-04-13 DIAGNOSIS — F902 Attention-deficit hyperactivity disorder, combined type: Secondary | ICD-10-CM | POA: Diagnosis not present

## 2021-04-13 MED ORDER — AMPHETAMINE-DEXTROAMPHET ER 20 MG PO CP24: 20.0000 mg | ORAL_CAPSULE | ORAL | 0 refills | Status: DC

## 2021-04-13 MED ORDER — PAROXETINE HCL 40 MG PO TABS
40.0000 mg | ORAL_TABLET | Freq: Every day | ORAL | 1 refills | Status: DC
Start: 2021-04-13 — End: 2021-08-10

## 2021-04-13 MED ORDER — AMPHETAMINE-DEXTROAMPHET ER 20 MG PO CP24: 20.0000 mg | ORAL_CAPSULE | Freq: Every day | ORAL | 0 refills | Status: DC

## 2021-04-13 NOTE — Patient Instructions (Signed)
Great to see you today.  I have refilled the medication(s) we provide.   If labs were collected, we will inform you of lab results once received either by echart message or telephone call.   - echart message- for normal results that have been seen by the patient already.   - telephone call: abnormal results or if patient has not viewed results in their echart.  

## 2021-04-13 NOTE — Progress Notes (Addendum)
This visit occurred during the SARS-CoV-2 public health emergency.  Safety protocols were in place, including screening questions prior to the visit, additional usage of staff PPE, and extensive cleaning of exam room while observing appropriate contact time as indicated for disinfecting solutions.    Patient ID: Kathleen Robinson, female  DOB: July 30, 1995, 25 y.o.   MRN: [id number] Patient Care Team    Relationship Specialty Notifications Start End  Kathleen Hillock, DO PCP - General Family Medicine  06/03/20     Chief Complaint  Patient presents with   ADHD    [facility]; pt is not fasting     Subjective: Kathleen Robinson is a 25 y.o.  Female  present for [facility] Attention deficit hyperactivity disorder (ADHD), combined type Patient reports she was diagnosed with ADHD when she was young child.  She reports she would like to continue her ADHD medicine of Adderall 20 mg XR daily.  Patient reports medications are working well for her.  Patient understands that this is a controlled substance and she will need to be seen every 2.5 months in order not to run out of prescriptions.  Her contract is up-to-date. After January she may elect to try Vyvanse in place of Adderall if on formulary.   Severe recurrent major depression without psychotic features (HCC)/H/o Suicide attempt (HCC)/Anxiety and depression Pt reports she is feeling well on paxil 40 mg daily.  She has noted more difficulty falling asleep over the last few weeks.  She does notice seasonal affective disorder yearly.  She would like to keep medications the same for now and try more frequent use of melatonin.   Prior note: Patient reports she has been diagnosed with depression, anxiety and mood disorder at the age of 87.  At that time she was tried on Zoloft and one other assess RI medication that did not work well for her.  Paxil was also tried at that time and worked great.  She states "it changed her life.  "She has sought out counseling as an adult and  is currently attending counseling sessions with Hildred Alamin at [facility].  She does have a history of suicide attempt in August 2019.  At that time she had just come back from [place] and had a traumatic experience in [place].  She reports when she did suicide she had taken pills.  She was admitted to the behavioral health unit.  She has had no suicidal ideations since that time.  She reports with her illness she can be straightforward and blunt.  She feels some people takes her as being rude but she does not necessarily mean her actions in a rude manner.  By her own words, she can be "vindictive.  "She recently quit her job.  She felt the manager was controlling and mean.  Patient states she quit the job right before shift so that there was not coverage. She is currently engaged as of 11/2019.  She reports her fianc is bipolar and has had a traumatic experience. paxil 30    Depression screen [facility] 2/9 04/13/2021 01/20/2021 09/09/2020 06/03/2020 01/29/2020  Decreased Interest '2 3 2 1 ' 0  Down, Depressed, Hopeless '3 1 2 ' 0 2  PHQ - 2 Score '5 4 4 1 2  ' Altered sleeping 2 2 0 0 1  Tired, decreased energy '3 3 1 3 2  ' Change in appetite '1 1 2 1 ' 0  Feeling bad or failure about yourself  1 2 0 0 1  Trouble concentrating 3  '3 2 3 3  ' Moving slowly or fidgety/restless 0 0 0 2 1  Suicidal thoughts 0 1 0 0 0  PHQ-9 Score '15 16 9 10 10  ' Difficult doing work/chores - - - Somewhat difficult Somewhat difficult   GAD 7 : Generalized Anxiety Score 04/13/2021 01/20/2021 09/09/2020 01/29/2020  Nervous, Anxious, on Edge 3 1 0 1  Control/stop worrying 0 0 0 0  Worry too much - different things '1 2 1 1  ' Trouble relaxing '2 2 1 ' 0  Restless 1 0 0 0  Easily annoyed or irritable '1 1 2 1  ' Afraid - awful might happen 0 0 0 0  Total GAD 7 Score '8 6 4 3  ' Anxiety Difficulty - - - Somewhat difficult     Immunization History  Administered Date(s) Administered   DTaP 09/24/1993, 02/01/1994, 05/12/1994, 01/17/1995,  11/19/1998   Hepatitis A 11/29/2010, 06/01/2011   Hepatitis B 10/17/1993, 09/24/1993, 03/04/1994   HiB (PRP-OMP) 09/24/1993, 02/01/1994, 05/12/1994, 01/17/1995   Hpv-Unspecified 10/10/2006, 12/12/2006, 06/18/2007   IPV 09/24/1993, 02/01/1994, 05/12/1994, 11/19/1998   Influenza,inj,Quad PF,6+ Mos 04/13/2021   Influenza-Unspecified 02/22/2012, 05/01/2014, 05/06/2015, 02/06/2019, 03/25/2020   MMR 01/17/1995, 11/19/1998   Meningococcal Conjugate 11/29/2010   Meningococcal Polysaccharide 11/09/2009   PFIZER(Purple Top)SARS-COV-2 Vaccination 09/10/2019, 10/01/2019, 04/25/2020   Td 09/25/2007, 11/17/2015   Tdap 11/17/2015   Varicella 01/17/1995   Yellow Fever 07/10/2013     Past Medical History:  Diagnosis Date   Depression    Postural dizziness with presyncope    Shingles    Suicide attempt ([place]) 2019   [facility]- admission. Took pills.    Allergies  Allergen Reactions   Wasp Venom Swelling   Past Surgical History:  Procedure Laterality Date   NO PAST SURGERIES     Family History  Problem Relation Age of Onset   Diabetes Other    Stroke Other    Depression Mother    Hyperlipidemia Father    Depression Maternal Grandmother    Alcohol abuse Maternal Grandmother    Heart attack Maternal Grandfather    Arthritis Paternal Grandmother    Heart disease Paternal Grandmother    Hypertension Paternal Grandmother    Hyperlipidemia Paternal Grandmother    Miscarriages / Stillbirths Paternal Grandmother    Mitral valve prolapse Paternal Grandmother    Alcohol abuse Paternal Grandfather    Depression Paternal Grandfather    Heart disease Paternal Grandfather    Hypertension Paternal Grandfather    Hyperlipidemia Paternal Grandfather    Social History   Social History Narrative   Marital status/children/pets: Single   Education/employment: B.A. , Deposit rep   Safety:      -Wears a bicycle helmet riding a bike: Yes     -smoke alarm in the home:Yes     - wears seatbelt: Yes     -  Feels safe in their relationships: Yes    Allergies as of 04/13/2021       Reactions   Wasp Venom Swelling        Medication List        Accurate as of April 13, 2021 12:45 PM. If you have any questions, ask your nurse or doctor.          amphetamine-dextroamphetamine 20 MG 24 hr capsule Commonly known as: Adderall XR Take 1 capsule (20 mg total) by mouth daily.   amphetamine-dextroamphetamine 20 MG 24 hr capsule Commonly known as: ADDERALL XR Take 1 capsule (20 mg total) by mouth every morning.   amphetamine-dextroamphetamine 20  MG 24 hr capsule Commonly known as: ADDERALL XR Take 1 capsule (20 mg total) by mouth every morning.   PARoxetine 40 MG tablet Commonly known as: PAXIL Take 1 tablet (40 mg total) by mouth daily.        All past medical history, surgical history, allergies, family history, immunizations andmedications were updated in the EMR today and reviewed under the history and medication portions of their EMR.     No results found for this or any previous visit (from the past 2160 hour(s)).   ROS: 14 pt review of systems performed and negative (unless mentioned in an HPI)  Objective: BP 128/79    Pulse 96    Temp 98.9 F (37.2 C) (Oral)    Ht '5\' 5"'  (1.651 m)    Wt 168 lb (76.2 kg)    LMP 03/30/2021    SpO2 100%    BMI 27.96 kg/m  Physical Exam Vitals and nursing note reviewed.  Constitutional:      General: She is not in acute distress.    Appearance: Normal appearance. She is not ill-appearing, toxic-appearing or diaphoretic.  HENT:     Head: Normocephalic and atraumatic.     Mouth/Throat:     Mouth: Mucous membranes are moist.  Eyes:     General: No scleral icterus.       Right eye: No discharge.        Left eye: No discharge.     Extraocular Movements: Extraocular movements intact.     Conjunctiva/sclera: Conjunctivae normal.     Pupils: Pupils are equal, round, and reactive to light.  Cardiovascular:     Rate and Rhythm: Normal  rate and regular rhythm.  Pulmonary:     Effort: Pulmonary effort is normal. No respiratory distress.     Breath sounds: Normal breath sounds. No wheezing, rhonchi or rales.  Musculoskeletal:     Cervical back: Neck supple. No tenderness.  Lymphadenopathy:     Cervical: No cervical adenopathy.  Skin:    Coloration: Skin is not jaundiced or pale.     Findings: No erythema or rash.  Neurological:     Mental Status: She is alert and oriented to person, place, and time. Mental status is at baseline.     Motor: No weakness.     Gait: Gait normal.  Psychiatric:        Mood and Affect: Mood normal.        Behavior: Behavior normal.        Thought Content: Thought content normal.        Judgment: Judgment normal.    No results found.  Assessment/plan: Lilla Callejo is a 25 y.o. female present for [facility] Attention deficit hyperactivity disorder (ADHD), combined type Stable Continue Adderall 20 mg daily-she may elect to try Vyvanse after the new year if on formulary and affordable. [place] controlled substance database reviewed today and appropriate UDS up-to-date Contract signed today Follow-up 2.5 months face-to-face.     Severe recurrent major depression without psychotic features (HCC)/H/o Suicide attempt (HCC)/Anxiety and depression Stable Continue Paxil to 40 mg daily. For her difficulty sleeping she is going to attempt to try more routine use of the melatonin, if still an issue on follow-up we can address with medication such as Vistaril, trazodone or Remeron. Follow-up 5.5 months, sooner if needed  Influenza vaccine provided today   Return in about 11 weeks (around 06/29/2021) for [facility] (30 min).  Orders Placed This Encounter  Procedures   Flu  Vaccine QUAD 6+ mos PF IM (Fluarix Quad PF)    Meds ordered this encounter  Medications   PARoxetine (PAXIL) 40 MG tablet    Sig: Take 1 tablet (40 mg total) by mouth daily.    Dispense:  90 tablet    Refill:  1    amphetamine-dextroamphetamine (ADDERALL XR) 20 MG 24 hr capsule    Sig: Take 1 capsule (20 mg total) by mouth daily.    Dispense:  30 capsule    Refill:  0   amphetamine-dextroamphetamine (ADDERALL XR) 20 MG 24 hr capsule    Sig: Take 1 capsule (20 mg total) by mouth every morning.    Dispense:  30 capsule    Refill:  0    May fill ~60 days after date on prescription.   amphetamine-dextroamphetamine (ADDERALL XR) 20 MG 24 hr capsule    Sig: Take 1 capsule (20 mg total) by mouth every morning.    Dispense:  30 capsule    Refill:  0    May fill ~30 days after prescription date    Referral Orders  No referral(s) requested today     Electronically signed by: Howard Pouch, [place]

## 2021-05-03 ENCOUNTER — Encounter: Payer: Self-pay | Admitting: Family Medicine

## 2021-05-10 NOTE — Telephone Encounter (Signed)
I have scheduled patient for 1/19 at 8AM with Dr. Claiborne Billings.  Patient is requesting a virtual appt.  Can this be approved for her?  Thank you, Annabelle Harman

## 2021-05-13 ENCOUNTER — Encounter: Payer: Self-pay | Admitting: Family Medicine

## 2021-05-13 ENCOUNTER — Telehealth: Payer: Self-pay | Admitting: Family Medicine

## 2021-05-13 ENCOUNTER — Other Ambulatory Visit: Payer: Self-pay

## 2021-05-13 ENCOUNTER — Ambulatory Visit: Payer: BC Managed Care – PPO | Admitting: Family Medicine

## 2021-05-13 VITALS — BP 125/82 | HR 94 | Temp 98.6°F | Wt 171.6 lb

## 2021-05-13 DIAGNOSIS — F332 Major depressive disorder, recurrent severe without psychotic features: Secondary | ICD-10-CM

## 2021-05-13 DIAGNOSIS — F32A Depression, unspecified: Secondary | ICD-10-CM | POA: Diagnosis not present

## 2021-05-13 DIAGNOSIS — F419 Anxiety disorder, unspecified: Secondary | ICD-10-CM

## 2021-05-13 NOTE — Telephone Encounter (Signed)
I have completed patient's FMLA forms. Please call patient and obtain fax number she would like [place] to fax these to. Please scan a copy into our system and provide patient with original if she desires. She can either pick them up or we can mail them to her.  FMLA papers returned to [employer] work basket.

## 2021-05-13 NOTE — Telephone Encounter (Signed)
MyChart Message sent  

## 2021-05-13 NOTE — Progress Notes (Signed)
This visit occurred during the SARS-CoV-2 public health emergency.  Safety protocols were in place, including screening questions prior to the visit, additional usage of staff PPE, and extensive cleaning of exam room while observing appropriate contact time as indicated for disinfecting solutions.    Patient ID: Kathleen Robinson, female  DOB: 1995-07-22, 26 y.o.   MRN: [id number] Patient Care Team    Relationship Specialty Notifications Start End  Ma Hillock, DO PCP - General Family Medicine  06/03/20     Chief Complaint  Patient presents with   Mental Health     Subjective: Kathleen Robinson is a 26 y.o.  Female  present for depression and anxiety.  Patient has been attending therapy sessions every 2 weeks since spring 2022.  She is having flares in which she feels will cause her to become overstimulated, fatigued in which she has difficulty getting out of bed, focusing, paying attention to detail.  She states she can get overstimulated in the office with the lights, sounds and foot traffic.  She has been hospitalized on 2 occasions in the past due to her mental health.  She feels if she can have 1 to 2 days off during times when she feels overstimulated it would help her with her mental health.  She feels her medication regimen is working well for her at this time.     Prior note: Patient reports she has been diagnosed with depression, anxiety and mood disorder at the age of 71.  At that time she was tried on Zoloft and one other assess RI medication that did not work well for her.  Paxil was also tried at that time and worked great.  She states "it changed her life.  "She has sought out counseling as an adult and is currently attending counseling sessions with Hildred Alamin at [place] [facility].  She does have a history of suicide attempt in August 2019.  At that time she had just come back from [place] and had a traumatic experience in [place].  She reports when she did suicide she had taken  pills.  She was admitted to the behavioral health unit.  She has had no suicidal ideations since that time.  She reports with her illness she can be straightforward and blunt.  She feels some people takes her as being rude but she does not necessarily mean her actions in a rude manner.  By her own words, she can be "vindictive.  "She recently quit her job.  She felt the manager was controlling and mean.  Patient states she quit the job right before shift so that there was not coverage. She is currently engaged as of 11/2019.  She reports her fianc is bipolar and has had a traumatic experience. paxil 30    Depression screen [facility] 2/9 05/13/2021 04/13/2021 01/20/2021 09/09/2020 06/03/2020  Decreased Interest '3 2 3 2 1  ' Down, Depressed, Hopeless '3 3 1 2 ' 0  PHQ - 2 Score '6 5 4 4 1  ' Altered sleeping '2 2 2 ' 0 0  Tired, decreased energy '3 3 3 1 3  ' Change in appetite '3 1 1 2 1  ' Feeling bad or failure about yourself  '2 1 2 ' 0 0  Trouble concentrating '3 3 3 2 3  ' Moving slowly or fidgety/restless 1 0 0 0 2  Suicidal thoughts 0 0 1 0 0  PHQ-9 Score '20 15 16 9 10  ' Difficult doing work/chores - - - - Somewhat difficult  Some recent data  might be hidden   GAD 7 : Generalized Anxiety Score 05/13/2021 04/13/2021 01/20/2021 09/09/2020  Nervous, Anxious, on Edge '3 3 1 ' 0  Control/stop worrying 2 0 0 0  Worry too much - different things '2 1 2 1  ' Trouble relaxing '3 2 2 1  ' Restless 2 1 0 0  Easily annoyed or irritable '2 1 1 2  ' Afraid - awful might happen 0 0 0 0  Total GAD 7 Score '14 8 6 4  ' Anxiety Difficulty - - - -     Immunization History  Administered Date(s) Administered   DTaP 09/24/1993, 02/01/1994, 05/12/1994, 01/17/1995, 11/19/1998   Hepatitis A 11/29/2010, 06/01/2011   Hepatitis B 1994/01/28, 09/24/1993, 03/04/1994   HiB (PRP-OMP) 09/24/1993, 02/01/1994, 05/12/1994, 01/17/1995   Hpv-Unspecified 10/10/2006, 12/12/2006, 06/18/2007   IPV 09/24/1993, 02/01/1994, 05/12/1994, 11/19/1998    Influenza,inj,Quad PF,6+ Mos 04/13/2021   Influenza-Unspecified 02/22/2012, 05/01/2014, 05/06/2015, 02/06/2019, 03/25/2020   MMR 01/17/1995, 11/19/1998   Meningococcal Conjugate 11/29/2010   Meningococcal Polysaccharide 11/09/2009   PFIZER(Purple Top)SARS-COV-2 Vaccination 09/10/2019, 10/01/2019, 04/25/2020   Td 09/25/2007, 11/17/2015   Tdap 11/17/2015   Varicella 01/17/1995   Yellow Fever 07/10/2013     Past Medical History:  Diagnosis Date   Depression    Postural dizziness with presyncope    Shingles    Suicide attempt ([place]) 2019   [facility]- admission. Took pills.    Allergies  Allergen Reactions   Wasp Venom Swelling   Past Surgical History:  Procedure Laterality Date   NO PAST SURGERIES     Family History  Problem Relation Age of Onset   Diabetes Other    Stroke Other    Depression Mother    Hyperlipidemia Father    Depression Maternal Grandmother    Alcohol abuse Maternal Grandmother    Heart attack Maternal Grandfather    Arthritis Paternal Grandmother    Heart disease Paternal Grandmother    Hypertension Paternal Grandmother    Hyperlipidemia Paternal Grandmother    Miscarriages / Stillbirths Paternal Grandmother    Mitral valve prolapse Paternal Grandmother    Alcohol abuse Paternal Grandfather    Depression Paternal Grandfather    Heart disease Paternal Grandfather    Hypertension Paternal Grandfather    Hyperlipidemia Paternal Grandfather    Social History   Social History Narrative   Marital status/children/pets: Single   Education/employment: B.A. , Deposit rep   Safety:      -Wears a bicycle helmet riding a bike: Yes     -smoke alarm in the home:Yes     - wears seatbelt: Yes     - Feels safe in their relationships: Yes    Allergies as of 05/13/2021       Reactions   Wasp Venom Swelling        Medication List        Accurate as of May 13, 2021  1:49 PM. If you have any questions, ask your nurse or doctor.           amphetamine-dextroamphetamine 20 MG 24 hr capsule Commonly known as: Adderall XR Take 1 capsule (20 mg total) by mouth daily.   amphetamine-dextroamphetamine 20 MG 24 hr capsule Commonly known as: ADDERALL XR Take 1 capsule (20 mg total) by mouth every morning.   amphetamine-dextroamphetamine 20 MG 24 hr capsule Commonly known as: ADDERALL XR Take 1 capsule (20 mg total) by mouth every morning.   PARoxetine 40 MG tablet Commonly known as: PAXIL Take 1 tablet (40 mg total) by mouth daily.  All past medical history, surgical history, allergies, family history, immunizations andmedications were updated in the EMR today and reviewed under the history and medication portions of their EMR.     No results found for this or any previous visit (from the past 2160 hour(s)).   ROS: 14 pt review of systems performed and negative (unless mentioned in an HPI)  Objective: BP 125/82    Pulse 94    Temp 98.6 F (37 C)    Wt 171 lb 9.6 oz (77.8 kg)    LMP 05/03/2021    SpO2 97%    BMI 28.56 kg/m  Physical Exam Vitals and nursing note reviewed.  Constitutional:      General: She is not in acute distress.    Appearance: Normal appearance. She is not ill-appearing, toxic-appearing or diaphoretic.  HENT:     Head: Normocephalic and atraumatic.  Eyes:     General: No scleral icterus.       Right eye: No discharge.        Left eye: No discharge.     Extraocular Movements: Extraocular movements intact.     Conjunctiva/sclera: Conjunctivae normal.     Pupils: Pupils are equal, round, and reactive to light.  Skin:    Findings: No rash.  Neurological:     Mental Status: She is alert and oriented to person, place, and time. Mental status is at baseline.     Motor: No weakness.     Gait: Gait normal.  Psychiatric:        Mood and Affect: Mood normal.        Behavior: Behavior normal.        Thought Content: Thought content normal.        Judgment: Judgment normal.    No results  found.  Assessment/plan: Kathleen Robinson is a 26 y.o. female present for   Severe recurrent major depression without psychotic features (HCC)/H/o Suicide attempt (HCC)/Anxiety and depression/ADHD Stable.  Agreed to complete FMLA paperwork for her today.  She is aware this is used during episodic flares and treatment days only. She will continue Paxil to 40 mg daily. Will be completed today and faxed to her employer.  No follow-ups on file.  No orders of the defined types were placed in this encounter.   No orders of the defined types were placed in this encounter.   Referral Orders  No referral(s) requested today     Electronically signed by: Howard Pouch, [place]

## 2021-05-13 NOTE — Telephone Encounter (Signed)
Forms faxed

## 2021-05-13 NOTE — Patient Instructions (Signed)
°  We will get your forms faxed today, if you get [place] the proper fax #.

## 2021-06-28 ENCOUNTER — Other Ambulatory Visit: Payer: Self-pay

## 2021-06-29 ENCOUNTER — Encounter: Payer: Self-pay | Admitting: Family Medicine

## 2021-06-29 NOTE — Progress Notes (Signed)
Patient no showed for appointment

## 2021-06-29 NOTE — Patient Instructions (Signed)
?  Great to see you today.  ?I have refilled the medication(s) we provide.  ? ?If labs were collected, we will inform you of lab results once received either by echart message or telephone call.  ? - echart message- for normal results that have been seen by the patient already.  ? - telephone call: abnormal results or if patient has not viewed results in their echart. ?Follow up in 2.5 mos.  ?

## 2021-06-30 ENCOUNTER — Encounter: Payer: Self-pay | Admitting: Family Medicine

## 2021-08-10 ENCOUNTER — Ambulatory Visit (INDEPENDENT_AMBULATORY_CARE_PROVIDER_SITE_OTHER): Payer: 59 | Admitting: Family Medicine

## 2021-08-10 ENCOUNTER — Encounter: Payer: Self-pay | Admitting: Family Medicine

## 2021-08-10 DIAGNOSIS — F332 Major depressive disorder, recurrent severe without psychotic features: Secondary | ICD-10-CM

## 2021-08-10 DIAGNOSIS — F902 Attention-deficit hyperactivity disorder, combined type: Secondary | ICD-10-CM | POA: Diagnosis not present

## 2021-08-10 DIAGNOSIS — R69 Illness, unspecified: Secondary | ICD-10-CM | POA: Diagnosis not present

## 2021-08-10 MED ORDER — AMPHETAMINE-DEXTROAMPHET ER 20 MG PO CP24: 20.0000 mg | ORAL_CAPSULE | ORAL | 0 refills | Status: DC

## 2021-08-10 MED ORDER — AMPHETAMINE-DEXTROAMPHET ER 20 MG PO CP24: 20.0000 mg | ORAL_CAPSULE | Freq: Every day | ORAL | 0 refills | Status: DC

## 2021-08-10 MED ORDER — PAROXETINE HCL 40 MG PO TABS: 40.0000 mg | ORAL_TABLET | Freq: Every day | ORAL | 1 refills | Status: DC

## 2021-08-10 NOTE — Patient Instructions (Signed)
?  Great to see you today.  ?I have refilled the medication(s) we provide.  ? ?If labs were collected, we will inform you of lab results once received either by echart message or telephone call.  ? - echart message- for normal results that have been seen by the patient already.  ? - telephone call: abnormal results or if patient has not viewed results in their echart. ? ? ?Return in about 15 weeks (around 11/23/2021) for Routine chronic condition follow-up. ? ?

## 2021-08-10 NOTE — Progress Notes (Signed)
? ?This visit occurred during the SARS-CoV-2 public health emergency.  Safety protocols were in place, including screening questions prior to the visit, additional usage of staff PPE, and extensive cleaning of exam room while observing appropriate contact time as indicated for disinfecting solutions.  ? ? ?Patient ID: Kathleen Robinson, female  DOB: [birth year], 26 y.o.   MRN: [id number] ?Patient Care Team  ?  Relationship Specialty Notifications Start End  ?Ma Hillock, DO PCP - General Family Medicine  06/03/20   ? ? ?Chief Complaint  ?Patient presents with  ? ADHD  ?  Cmc; pt is not fasting  ? ? ?Subjective: ?Kathleen Robinson is a 26 y.o.  Female  present for [facility] ?Attention deficit hyperactivity disorder (ADHD), combined type ?Patient reports she was diagnosed with ADHD when she was young child.  She reports she would like to continue her ADHD medicine of Adderall 20 mg XR daily.  Patient reports medications are working well for her.  Patient understands that this is a controlled substance and she will need to be seen every 2.5 months in order not to run out of prescriptions.  Her contract is up-to-date. ? ?  ?Severe recurrent major depression without psychotic features (HCC)/H/o Suicide attempt (HCC)/Anxiety and depression ?Pt reports she is doing well on paxil 40 mg daily.    She does notice seasonal affective disorder yearly.  She is established with a therapist and has routine visits.  She states she was diagnosed with autism recently.  She states this diagnosis has been a relief to her and she now understands why she has certain mannerisms. ?Prior note: ?Patient reports she has been diagnosed with depression, anxiety and mood disorder at the age of 32.  At that time she was tried on Zoloft and one other assess RI medication that did not work well for her.  Paxil was also tried at that time and worked great.  She states "it changed her life.  "She has sought out counseling as an adult and is currently attending  counseling sessions with Hildred Alamin at [facility].  She does have a history of suicide attempt in August 2019.  At that time she had just come back from [place] and had a traumatic experience in [place].  She reports when she did suicide she had taken pills.  She was admitted to the behavioral health unit.  She has had no suicidal ideations since that time.  She reports with her illness she can be straightforward and blunt.  She feels some people takes her as being rude but she does not necessarily mean her actions in a rude manner.  By her own words, she can be "vindictive.  "She recently quit her job.  She felt the manager was controlling and mean.  Patient states she quit the job right before shift so that there was not coverage. ?She is currently engaged as of 11/2019.  She reports her fianc? is bipolar and has had a traumatic experience. ?paxil 30 ?  ? ? ?  05/13/2021  ?  8:15 AM 04/13/2021  ?  9:50 AM 01/20/2021  ? 10:45 AM 09/09/2020  ?  2:50 PM 06/03/2020  ?  8:53 AM  ?Depression screen PHQ 2/9  ?Decreased Interest '3 2 3 2 1  ' ?Down, Depressed, Hopeless '3 3 1 2 ' 0  ?PHQ - 2 Score '6 5 4 4 1  ' ?Altered sleeping '2 2 2 ' 0 0  ?Tired, decreased energy '3 3 3 1 3  ' ?Change in appetite  '3 1 1 2 1  ' ?Feeling bad or failure about yourself  '2 1 2 ' 0 0  ?Trouble concentrating '3 3 3 2 3  ' ?Moving slowly or fidgety/restless 1 0 0 0 2  ?Suicidal thoughts 0 0 1 0 0  ?PHQ-9 Score '20 15 16 9 10  ' ?Difficult doing work/chores     Somewhat difficult  ? ? ?  05/13/2021  ?  8:15 AM 04/13/2021  ?  9:50 AM 01/20/2021  ? 10:47 AM 09/09/2020  ?  2:51 PM  ?GAD 7 : Generalized Anxiety Score  ?Nervous, Anxious, on Edge '3 3 1 ' 0  ?Control/stop worrying 2 0 0 0  ?Worry too much - different things '2 1 2 1  ' ?Trouble relaxing '3 2 2 1  ' ?Restless 2 1 0 0  ?Easily annoyed or irritable '2 1 1 2  ' ?Afraid - awful might happen 0 0 0 0  ?Total GAD 7 Score '14 8 6 4  ' ? ? ?Immunization History  ?Administered Date(s) Administered  ? DTaP 09/24/1993, 02/01/1994,  05/12/1994, 01/17/1995, 11/19/1998  ? Hepatitis A 11/29/2010, 06/01/2011  ? Hepatitis B 1993/05/02, 09/24/1993, 03/04/1994  ? HiB (PRP-OMP) 09/24/1993, 02/01/1994, 05/12/1994, 01/17/1995  ? Hpv-Unspecified 10/10/2006, 12/12/2006, 06/18/2007  ? IPV 09/24/1993, 02/01/1994, 05/12/1994, 11/19/1998  ? Influenza,inj,Quad PF,6+ Mos 04/13/2021  ? Influenza-Unspecified 02/22/2012, 05/01/2014, 05/06/2015, 02/06/2019, 03/25/2020  ? MMR 01/17/1995, 11/19/1998  ? Meningococcal Conjugate 11/29/2010  ? Meningococcal Polysaccharide 11/09/2009  ? PFIZER(Purple Top)SARS-COV-2 Vaccination 09/10/2019, 10/01/2019, 04/25/2020  ? Td 09/25/2007, 11/17/2015  ? Tdap 11/17/2015  ? Varicella 01/17/1995  ? Yellow Fever 07/10/2013  ? ? ? ?Past Medical History:  ?Diagnosis Date  ? Autism   ? Depression   ? Postural dizziness with presyncope   ? Shingles   ? Suicide attempt [facility]) 2019  ? [facility]- admission. Took pills.   ? ?Allergies  ?Allergen Reactions  ? Wasp Venom Swelling  ? ?Past Surgical History:  ?Procedure Laterality Date  ? NO PAST SURGERIES    ? ?Family History  ?Problem Relation Age of Onset  ? Diabetes Other   ? Stroke Other   ? Depression Mother   ? Hyperlipidemia Father   ? Depression Maternal Grandmother   ? Alcohol abuse Maternal Grandmother   ? Heart attack Maternal Grandfather   ? Arthritis Paternal Grandmother   ? Heart disease Paternal Grandmother   ? Hypertension Paternal Grandmother   ? Hyperlipidemia Paternal Grandmother   ? Miscarriages / Stillbirths Paternal Grandmother   ? Mitral valve prolapse Paternal Grandmother   ? Alcohol abuse Paternal Grandfather   ? Depression Paternal Grandfather   ? Heart disease Paternal Grandfather   ? Hypertension Paternal Grandfather   ? Hyperlipidemia Paternal Grandfather   ? ?Social History  ? ?Social History Narrative  ? Marital status/children/pets: Single  ? Education/employment: B.A. 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 Safety:   ?   -Wears a bicycle helmet riding a bike: Yes  ?   -smoke alarm in the  home:Yes  ?   - wears seatbelt: Yes  ?   - Feels safe in their relationships: Yes  ? ? ?Allergies as of 08/10/2021   ? ?   Reactions  ? Wasp Venom Swelling  ? ?  ? ?  ?Medication List  ?  ? ?  ? Accurate as of August 10, 2021 12:29 PM. If you have any questions, ask your nurse or doctor.  ?  ?  ? ?  ? ?amphetamine-dextroamphetamine 20 MG 24 hr capsule ?Commonly known  as: Adderall XR ?Take 1 capsule (20 mg total) by mouth daily. ?  ?amphetamine-dextroamphetamine 20 MG 24 hr capsule ?Commonly known as: ADDERALL XR ?Take 1 capsule (20 mg total) by mouth every morning. ?  ?amphetamine-dextroamphetamine 20 MG 24 hr capsule ?Commonly known as: ADDERALL XR ?Take 1 capsule (20 mg total) by mouth every morning. ?  ?PARoxetine 40 MG tablet ?Commonly known as: PAXIL ?Take 1 tablet (40 mg total) by mouth daily. ?  ? ?  ? ? ?All past medical history, surgical history, allergies, family history, immunizations andmedications were updated in the EMR today and reviewed under the history and medication portions of their EMR.    ? ?No results found for this or any previous visit (from the past 2160 hour(s)). ? ? ?ROS: 14 pt review of systems performed and negative (unless mentioned in an HPI) ? ?Objective: ?BP 122/69   Pulse 75   Temp 98.4 ?F (36.9 ?C) (Oral)   Ht '5\' 5"'  (1.651 m)   Wt 169 lb (76.7 kg)   SpO2 99%   BMI 28.12 kg/m?  ?Physical Exam ?Vitals and nursing note reviewed.  ?Constitutional:   ?   General: She is not in acute distress. ?   Appearance: Normal appearance. She is not ill-appearing, toxic-appearing or diaphoretic.  ?HENT:  ?   Head: Normocephalic and atraumatic.  ?Eyes:  ?   General: No scleral icterus.    ?   Right eye: No discharge.     ?   Left eye: No discharge.  ?   Extraocular Movements: Extraocular movements intact.  ?   Conjunctiva/sclera: Conjunctivae normal.  ?   Pupils: Pupils are equal, round, and reactive to light.  ?Cardiovascular:  ?   Rate and Rhythm: Normal rate and regular rhythm.  ?Pulmonary:   ?   Effort: Pulmonary effort is normal.  ?   Breath sounds: Normal breath sounds.  ?Neurological:  ?   Mental Status: She is alert and oriented to person, place, and time. Mental status is at baseline.  ?Psychiatric:

## 2021-10-06 ENCOUNTER — Telehealth: Payer: Self-pay

## 2021-10-06 ENCOUNTER — Other Ambulatory Visit (HOSPITAL_BASED_OUTPATIENT_CLINIC_OR_DEPARTMENT_OTHER): Payer: Self-pay

## 2021-10-06 DIAGNOSIS — F902 Attention-deficit hyperactivity disorder, combined type: Secondary | ICD-10-CM

## 2021-10-06 MED ORDER — AMPHETAMINE-DEXTROAMPHET ER 20 MG PO CP24
20.0000 mg | ORAL_CAPSULE | Freq: Every day | ORAL | 0 refills | Status: DC
  Filled 2021-10-06 – 2021-10-13 (×2): qty 30, 30d supply, fill #0

## 2021-10-06 NOTE — Telephone Encounter (Signed)
One prescription of Adderall Exar 20 [facility].  Please remind patient she will need an appointment prior to future refills, if she does not have 1 made already, since this is the third prescription.

## 2021-10-06 NOTE — Telephone Encounter (Signed)
Please advise if ok ?

## 2021-10-06 NOTE — Telephone Encounter (Signed)
Patient refill request.  Please call refill to new preferred pharmacy for this medication only (until told otherwise by patient)  Pharmacy has medication in stock. [facility] [place]  amphetamine-dextroamphetamine (ADDERALL XR) 20 MG 24 hr capsule [id number]

## 2021-10-07 NOTE — Telephone Encounter (Signed)
Pt informed

## 2021-10-13 ENCOUNTER — Other Ambulatory Visit (HOSPITAL_BASED_OUTPATIENT_CLINIC_OR_DEPARTMENT_OTHER): Payer: Self-pay

## 2021-11-02 ENCOUNTER — Ambulatory Visit (INDEPENDENT_AMBULATORY_CARE_PROVIDER_SITE_OTHER): Payer: 59 | Admitting: Family Medicine

## 2021-11-02 ENCOUNTER — Encounter: Payer: Self-pay | Admitting: Family Medicine

## 2021-11-02 VITALS — BP 126/85 | HR 89 | Temp 98.6°F | Ht 65.0 in | Wt 169.0 lb

## 2021-11-02 DIAGNOSIS — Z113 Encounter for screening for infections with a predominantly sexual mode of transmission: Secondary | ICD-10-CM

## 2021-11-02 DIAGNOSIS — F640 Transsexualism: Secondary | ICD-10-CM | POA: Diagnosis not present

## 2021-11-02 DIAGNOSIS — Z8249 Family history of ischemic heart disease and other diseases of the circulatory system: Secondary | ICD-10-CM | POA: Diagnosis not present

## 2021-11-02 DIAGNOSIS — F902 Attention-deficit hyperactivity disorder, combined type: Secondary | ICD-10-CM

## 2021-11-02 DIAGNOSIS — R69 Illness, unspecified: Secondary | ICD-10-CM | POA: Diagnosis not present

## 2021-11-02 DIAGNOSIS — F419 Anxiety disorder, unspecified: Secondary | ICD-10-CM | POA: Diagnosis not present

## 2021-11-02 DIAGNOSIS — F32A Depression, unspecified: Secondary | ICD-10-CM | POA: Diagnosis not present

## 2021-11-02 DIAGNOSIS — F332 Major depressive disorder, recurrent severe without psychotic features: Secondary | ICD-10-CM

## 2021-11-02 DIAGNOSIS — Z0000 Encounter for general adult medical examination without abnormal findings: Secondary | ICD-10-CM

## 2021-11-02 DIAGNOSIS — Z79899 Other long term (current) drug therapy: Secondary | ICD-10-CM

## 2021-11-02 LAB — COMPREHENSIVE METABOLIC PANEL
ALT: 6 U/L (ref 0–35)
AST: 15 U/L (ref 0–37)
Albumin: 4.6 g/dL (ref 3.5–5.2)
Alkaline Phosphatase: 79 U/L (ref 39–117)
BUN: 8 mg/dL (ref 6–23)
CO2: 28 mEq/L (ref 19–32)
Calcium: 9.5 mg/dL (ref 8.4–10.5)
Chloride: 103 mEq/L (ref 96–112)
Creatinine, Ser: 0.87 mg/dL (ref 0.40–1.20)
GFR: 90.79 mL/min (ref >60.00)
Glucose, Bld: 86 mg/dL (ref 70–99)
Potassium: 4.1 mEq/L (ref 3.5–5.1)
Sodium: 138 mEq/L (ref 135–145)
Total Bilirubin: 0.8 mg/dL (ref 0.2–1.2)
Total Protein: 7.2 g/dL (ref 6.0–8.3)

## 2021-11-02 LAB — CBC
HCT: 42.2 % (ref 36.0–46.0)
Hemoglobin: 14.4 g/dL (ref 12.0–15.0)
MCHC: 34 g/dL (ref 30.0–36.0)
MCV: 86.6 fl (ref 78.0–100.0)
Platelets: 334 10*3/uL (ref 150.0–400.0)
RBC: 4.88 Mil/uL (ref 3.87–5.11)
RDW: 13.8 % (ref 11.5–15.5)
WBC: 5.5 10*3/uL (ref 4.0–10.5)

## 2021-11-02 LAB — LIPID PANEL
Cholesterol: 221 mg/dL — ABNORMAL HIGH (ref 0–200)
HDL: 67 mg/dL (ref >39.00)
LDL Cholesterol: 134 mg/dL — ABNORMAL HIGH (ref 0–99)
NonHDL: 153.78
Total CHOL/HDL Ratio: 3
Triglycerides: 101 mg/dL (ref 0.0–149.0)
VLDL: 20.2 mg/dL (ref 0.0–40.0)

## 2021-11-02 LAB — HEMOGLOBIN A1C: Hgb A1c MFr Bld: 4.9 % (ref 4.6–6.5)

## 2021-11-02 LAB — TSH: TSH: 4.56 u[IU]/mL (ref 0.35–5.50)

## 2021-11-02 MED ORDER — PAROXETINE HCL 40 MG PO TABS: 40.0000 mg | ORAL_TABLET | Freq: Every day | ORAL | 1 refills | Status: DC

## 2021-11-02 MED ORDER — AMPHETAMINE-DEXTROAMPHET ER 20 MG PO CP24: 20.0000 mg | ORAL_CAPSULE | ORAL | 0 refills | Status: DC

## 2021-11-02 MED ORDER — AMPHETAMINE-DEXTROAMPHET ER 20 MG PO CP24
20.0000 mg | ORAL_CAPSULE | Freq: Every day | ORAL | 0 refills | Status: DC
Start: 2021-11-02 — End: 2022-01-12

## 2021-11-02 NOTE — Progress Notes (Signed)
Patient ID: Kathleen Robinson, female  DOB: 1995/12/16, 26 y.o.   MRN: [id number] Patient Care Team    Relationship Specialty Notifications Start End  Ma Hillock, DO PCP - General Family Medicine  06/03/20     Chief Complaint  Patient presents with   Annual Exam    Pt is not fasting    Subjective: Kathleen Robinson is a 26 y.o.   present for CPE/cmc. All past medical history, surgical history, allergies, family history, immunizations, medications and social history were updated in the electronic medical record today. All recent labs, ED visits and hospitalizations within the last year were reviewed.  Health maintenance:  Colonoscopy: no fhx, routine screen at 11. Mammogram: no fhx. Routine screen at 38  Cervical cancer screening: last pap: 08/2020, results: WNL/ neg hpv, completed by: PCP-Tuesday Terlecki 3 yr rpt Immunizations: tdap UTD 2017, Influenza  (encouraged yearly), covid series UTD Infectious disease screening: HIV completed, Hep C completed. Urine cytology - pt declined.  DEXA: routine screen Assistive device: none Oxygen OFB:PZWC Patient has a Dental home. Hospitalizations/ED visits: reviewed  Attention deficit hyperactivity disorder (ADHD), combined type Patient reports she was diagnosed with ADHD when she was young child.  Patient reports medications are working well.  Patient understands that adderall is a controlled substance and will need to be seen every 2.5 months in order not to run out of prescriptions.    contract is up-to-date.     Severe recurrent major depression without psychotic features (HCC)/H/o Suicide attempt (HCC)/Anxiety and depression Pt reports they are  feeling well  on paxil 40 mg daily.   does notice seasonal affective disorder yearly.   established with a therapist and has routine visits. was diagnosed with autism within last year.  states this diagnosis has been a relief  and now understands why they have  certain mannerisms.Since starting testosterone  (AFAB-female) he feels much better and believes the depression has greatly improved. Prior note: Patient reports she has been diagnosed with depression, anxiety and mood disorder at the age of 26.  At that time she was tried on Zoloft and one other assess RI medication that did not work well for her.  Paxil was also tried at that time and worked great.  She states "it changed her life.  "She has sought out counseling as an adult and is currently attending counseling sessions with Hildred Alamin at [facility].  She does have a history of suicide attempt in August 2019.  At that time she had just come back from [place] and had a traumatic experience in [place].  She reports when she did suicide she had taken pills.  She was admitted to the behavioral health unit.  She has had no suicidal ideations since that time.  She reports with her illness she can be straightforward and blunt.  She feels some people takes her as being rude but she does not necessarily mean her actions in a rude manner.  By her own words, she can be "vindictive.  "She recently quit her job.  She felt the manager was controlling and mean.  Patient states she quit the job right before shift so that there was not coverage. She is currently engaged as of 11/2019.  She reports her fianc is bipolar and has had a traumatic experience. paxil 30       11/02/2021    9:31 AM 05/13/2021    8:15 AM 04/13/2021    9:50 AM 01/20/2021   10:45 AM 09/09/2020  2:50 PM  Depression screen PHQ 2/9  Decreased Interest '1 3 2 3 2  ' Down, Depressed, Hopeless '1 3 3 1 2  ' PHQ - 2 Score '2 6 5 4 4  ' Altered sleeping '1 2 2 2 ' 0  Tired, decreased energy '1 3 3 3 1  ' Change in appetite 0 '3 1 1 2  ' Feeling bad or failure about yourself  '1 2 1 2 ' 0  Trouble concentrating '1 3 3 3 2  ' Moving slowly or fidgety/restless 0 1 0 0 0  Suicidal thoughts 0 0 0 1 0  PHQ-9 Score '6 20 15 16 9      ' 11/02/2021    9:31 AM 05/13/2021    8:15 AM 04/13/2021    9:50 AM 01/20/2021    10:47 AM  GAD 7 : Generalized Anxiety Score  Nervous, Anxious, on Edge '1 3 3 1  ' Control/stop worrying 0 2 0 0  Worry too much - different things 0 '2 1 2  ' Trouble relaxing '1 3 2 2  ' Restless '1 2 1 ' 0  Easily annoyed or irritable 0 '2 1 1  ' Afraid - awful might happen 0 0 0 0  Total GAD 7 Score '3 14 8 6      ' Immunization History  Administered Date(s) Administered   DTaP 09/24/1993, 02/01/1994, 05/12/1994, 01/17/1995, 11/19/1998   Hepatitis A 11/29/2010, 06/01/2011   Hepatitis B 10-28-93, 09/24/1993, 03/04/1994   HiB (PRP-OMP) 09/24/1993, 02/01/1994, 05/12/1994, 01/17/1995   Hpv-Unspecified 10/10/2006, 12/12/2006, 06/18/2007   IPV 09/24/1993, 02/01/1994, 05/12/1994, 11/19/1998   Influenza,inj,Quad PF,6+ Mos 04/13/2021   Influenza-Unspecified 02/22/2012, 05/01/2014, 05/06/2015, 02/06/2019, 03/25/2020   MMR 01/17/1995, 11/19/1998   Meningococcal Conjugate 11/29/2010   Meningococcal Polysaccharide 11/09/2009   PFIZER(Purple Top)SARS-COV-2 Vaccination 09/10/2019, 10/01/2019, 04/25/2020   Td 09/25/2007, 11/17/2015   Tdap 11/17/2015   Varicella 01/17/1995   Yellow Fever 07/10/2013     Past Medical History:  Diagnosis Date   Autism    Depression    Postural dizziness with presyncope    Shingles    Suicide attempt ([place]) 2019   [facility]- admission. Took pills.    Allergies  Allergen Reactions   Wasp Venom Swelling   Past Surgical History:  Procedure Laterality Date   NO PAST SURGERIES     Family History  Problem Relation Age of Onset   Diabetes Other    Stroke Other    Depression Mother    Hyperlipidemia Father    Depression Maternal Grandmother    Alcohol abuse Maternal Grandmother    Heart attack Maternal Grandfather    Arthritis Paternal Grandmother    Heart disease Paternal Grandmother    Hypertension Paternal Grandmother    Hyperlipidemia Paternal Grandmother    Miscarriages / Stillbirths Paternal Grandmother    Mitral valve prolapse Paternal Grandmother     Alcohol abuse Paternal Grandfather    Depression Paternal Grandfather    Heart disease Paternal Grandfather    Hypertension Paternal Grandfather    Hyperlipidemia Paternal Grandfather    Social History   Social History Narrative   Marital status/children/pets: Single   Education/employment: B.A. , Deposit rep   Safety:      -Wears a bicycle helmet riding a bike: Yes     -smoke alarm in the home:Yes     - wears seatbelt: Yes     - Feels safe in their relationships: Yes    Allergies as of 11/02/2021       Reactions   Wasp Venom Swelling  Medication List        Accurate as of November 02, 2021  2:52 PM. If you have any questions, ask your nurse or doctor.          amphetamine-dextroamphetamine 20 MG 24 hr capsule Commonly known as: ADDERALL XR Take 1 capsule (20 mg total) by mouth every morning.   amphetamine-dextroamphetamine 20 MG 24 hr capsule Commonly known as: ADDERALL XR Take 1 capsule (20 mg total) by mouth every morning.   amphetamine-dextroamphetamine 20 MG 24 hr capsule Commonly known as: Adderall XR Take 1 capsule (20 mg total) by mouth daily.   PARoxetine 40 MG tablet Commonly known as: PAXIL Take 1 tablet (40 mg total) by mouth daily.   testosterone cypionate 200 MG/ML injection Commonly known as: DEPOTESTOSTERONE CYPIONATE SMARTSIG:0.2 Milliliter(s) SUB-Q Once a Week        All past medical history, surgical history, allergies, family history, immunizations andmedications were updated in the EMR today and reviewed under the history and medication portions of their EMR.     No results found for this or any previous visit (from the past 2160 hour(s)).  No results found.   ROS 14 pt review of systems performed and negative (unless mentioned in an HPI)  Objective: BP 126/85   Pulse 89   Temp 98.6 F (37 C) (Oral)   Ht '5\' 5"'  (1.651 m)   Wt 169 lb (76.7 kg)   LMP 10/26/2021   SpO2 98%   BMI 28.12 kg/m  Physical Exam Vitals and  nursing note reviewed.  Constitutional:      General: Kathleen Robinson is not in acute distress.    Appearance: Normal appearance. Kathleen Robinson is not ill-appearing or toxic-appearing.  HENT:     Head: Normocephalic and atraumatic.     Right Ear: Tympanic membrane, ear canal and external ear normal. There is no impacted cerumen.     Left Ear: Tympanic membrane, ear canal and external ear normal. There is no impacted cerumen.     Nose: No congestion or rhinorrhea.     Mouth/Throat:     Mouth: Mucous membranes are moist.     Pharynx: Oropharynx is clear. No oropharyngeal exudate or posterior oropharyngeal erythema.  Eyes:     General: No scleral icterus.       Right eye: No discharge.        Left eye: No discharge.     Extraocular Movements: Extraocular movements intact.     Conjunctiva/sclera: Conjunctivae normal.     Pupils: Pupils are equal, round, and reactive to light.  Cardiovascular:     Rate and Rhythm: Normal rate and regular rhythm.     Pulses: Normal pulses.     Heart sounds: Normal heart sounds. No murmur heard.    No friction rub. No gallop.  Pulmonary:     Effort: Pulmonary effort is normal. No respiratory distress.     Breath sounds: Normal breath sounds. No stridor. No wheezing, rhonchi or rales.  Chest:     Chest wall: No tenderness.  Abdominal:     General: Abdomen is flat. Bowel sounds are normal. There is no distension.     Palpations: Abdomen is soft. There is no mass.     Tenderness: There is no abdominal tenderness. There is no right CVA tenderness, left CVA tenderness, guarding or rebound.     Hernia: No hernia is present.  Musculoskeletal:        General: No swelling, tenderness or deformity. Normal range of motion.  Cervical back: Normal range of motion and neck supple. No rigidity or tenderness.     Right lower leg: No edema.     Left lower leg: No edema.  Lymphadenopathy:     Cervical: No cervical adenopathy.  Skin:    General: Skin is warm and  dry.     Coloration: Skin is not jaundiced or pale.     Findings: No bruising, erythema, lesion or rash.  Neurological:     General: No focal deficit present.     Mental Status: Kathleen Robinson is alert and oriented to person, place, and time. Mental status is at baseline.     Cranial Nerves: No cranial nerve deficit.     Sensory: No sensory deficit.     Motor: No weakness.     Coordination: Coordination normal.     Gait: Gait normal.     Deep Tendon Reflexes: Reflexes normal.  Psychiatric:        Mood and Affect: Mood normal.        Behavior: Behavior normal.        Thought Content: Thought content normal.        Judgment: Judgment normal.      No results found.  Assessment/plan: Kathleen Robinson is a 26 y.o.  present for CPE/CMC Attention deficit hyperactivity disorder (ADHD), combined type Stable Continue Adderall 20 mg daily-may elect to try Vyvanse in the future, if on formulary and affordable. [place] controlled substance database reviewed today and appropriate UDS up-to-date Contract signed Follow-up 2.5 months face-to-face.     Severe recurrent major depression without psychotic features (HCC)/H/o Suicide attempt (HCC)/Anxiety and depression stable Continue Paxil to 40 mg daily. difficulty sleeping has greatly improved - TSH Follow-up 5.5 months, sooner if needed  FH: heart disease - Lipid panel  Encounter for long-term current use of medication/testosterone HRT -transgender - CBC - Comprehensive metabolic panel - Hemoglobin A1c - Lipid panel  Screen for STD (sexually transmitted disease) - declined Routine general medical examination at a health care facility Colonoscopy: no fhx, routine screen at 62. Mammogram: no fhx. Routine screen at 38  Cervical cancer screening: last pap: 08/2020, results: WNL/ neg hpv, completed by: PCP-Tanelle Lanzo 3 yr rpt Immunizations: tdap UTD 2017, Influenza  (encouraged yearly), covid series UTD Infectious disease screening:  HIV completed, Hep C completed. Urine cytology - pt declined.  DEXA: routine screen Patient was encouraged to exercise greater than 150 minutes a week. Patient was encouraged to choose a diet filled with fresh fruits and vegetables, and lean meats. AVS provided to patient today for education/recommendation on gender specific health and safety maintenance.  Return in about 1 year (around 11/04/2022) for cpe (20 min).  Orders Placed This Encounter  Procedures   CBC   Comprehensive metabolic panel   Hemoglobin A1c   Lipid panel   TSH   Meds ordered this encounter  Medications   PARoxetine (PAXIL) 40 MG tablet    Sig: Take 1 tablet (40 mg total) by mouth daily.    Dispense:  90 tablet    Refill:  1   amphetamine-dextroamphetamine (ADDERALL XR) 20 MG 24 hr capsule    Sig: Take 1 capsule (20 mg total) by mouth every morning.    Dispense:  30 capsule    Refill:  0    May fill ~60 days after date on prescription.   amphetamine-dextroamphetamine (ADDERALL XR) 20 MG 24 hr capsule    Sig: Take 1 capsule (20 mg total) by mouth every morning.  Dispense:  30 capsule    Refill:  0    May fill ~30 days after prescription date   amphetamine-dextroamphetamine (ADDERALL XR) 20 MG 24 hr capsule    Sig: Take 1 capsule (20 mg total) by mouth daily.    Dispense:  30 capsule    Refill:  0   Referral Orders  No referral(s) requested today     Electronically signed by: Howard Pouch, DO [place]

## 2021-11-02 NOTE — Patient Instructions (Addendum)

## 2022-01-12 ENCOUNTER — Encounter: Payer: Self-pay | Admitting: Family Medicine

## 2022-01-12 ENCOUNTER — Ambulatory Visit (INDEPENDENT_AMBULATORY_CARE_PROVIDER_SITE_OTHER): Payer: 59 | Admitting: Family Medicine

## 2022-01-12 VITALS — BP 121/83 | HR 82 | Temp 98.3°F | Ht 65.0 in | Wt 173.0 lb

## 2022-01-12 DIAGNOSIS — F332 Major depressive disorder, recurrent severe without psychotic features: Secondary | ICD-10-CM | POA: Diagnosis not present

## 2022-01-12 DIAGNOSIS — F902 Attention-deficit hyperactivity disorder, combined type: Secondary | ICD-10-CM | POA: Diagnosis not present

## 2022-01-12 DIAGNOSIS — Z23 Encounter for immunization: Secondary | ICD-10-CM | POA: Diagnosis not present

## 2022-01-12 DIAGNOSIS — Z8249 Family history of ischemic heart disease and other diseases of the circulatory system: Secondary | ICD-10-CM | POA: Diagnosis not present

## 2022-01-12 DIAGNOSIS — R69 Illness, unspecified: Secondary | ICD-10-CM | POA: Diagnosis not present

## 2022-01-12 MED ORDER — AMPHETAMINE-DEXTROAMPHET ER 20 MG PO CP24: 20.0000 mg | ORAL_CAPSULE | ORAL | 0 refills | Status: DC

## 2022-01-12 MED ORDER — AMPHETAMINE-DEXTROAMPHET ER 20 MG PO CP24: 20.0000 mg | ORAL_CAPSULE | Freq: Every day | ORAL | 0 refills | Status: DC

## 2022-01-12 NOTE — Progress Notes (Signed)
Patient ID: Kathleen Robinson, female  DOB: March 13, 1996, 26 y.o.   MRN: [id number] Patient Care Team    Relationship Specialty Notifications Start End  Ma Hillock, DO PCP - General Family Medicine  06/03/20     Chief Complaint  Patient presents with   ADHD    Cmc;     Subjective: Kathleen Robinson is a 26 y.o.   present for cmc. All past medical history, surgical history, allergies, family history, immunizations, medications and social history were updated in the electronic medical record today. All recent labs, ED visits and hospitalizations within the last year were reviewed.   Attention deficit hyperactivity disorder (ADHD), combined type Patient reports they were diagnosed with ADHD when they were a young child.  Patient reports medication/Adderall  have been working very well.      Severe recurrent major depression without psychotic features (HCC)/H/o Suicide attempt (HCC)/Anxiety and depression Pt reports they are  feeling well on paxil 40 mg daily.   does notice seasonal affective disorder yearly.   established with a therapist and has routine visits. Therapist feels pt may be autistic.  Patient states this diagnosis has been a relief  and now understands why they have  certain mannerisms.Since starting testosterone (AFAB-female), he feels much better and believes the depression has greatly improved. They feel great. Now desires "Kathleen Robinson" for name.  Prior note: Patient reports  has been diagnosed with depression, anxiety and mood disorder at the age of 74.  At that time  was tried on Zoloft and one other assess RI medication that did not work well.  Paxil was also tried at that time and worked great.   states "it changed her life.  " has sought out counseling as an adult and is currently attending counseling sessions with Hildred Alamin at [facility]. does have a history of suicide attempt in August 2019.  At that time  had just come back from [place] and had a traumatic  experience in [place].  She reports when she did suicide she had taken pills.  She was admitted to the behavioral health unit.  She has had no suicidal ideations since that time.  She reports with her illness she can be straightforward and blunt.  She feels some people takes heas being rude but he does not necessarily mean their actions in a rude manner.  By his own words, he can be "vindictive.  "he recently quit her job.  he felt the manager was controlling and mean.  Patient states she quit the job right before shift so that there was not coverage. he is currently engaged as of 11/2019.  he reports  fianc is bipolar and has had a traumatic experience.      01/12/2022    8:41 AM 11/02/2021    9:31 AM 05/13/2021    8:15 AM 04/13/2021    9:50 AM 01/20/2021   10:45 AM  Depression screen PHQ 2/9  Decreased Interest 0 _0 Down, Depressed, Hopeless _1 PHQ - 2 Score _2 Altered sleeping 0 _3 Tired, decreased energy _4 Change in appetite 0 0 _5 Feeling bad or failure about yourself  _6 Trouble concentrating 0 _7 Moving slowly or fidgety/restless 0 0 1 0 0  Suicidal thoughts 1 0 0 0 1  PHQ-9 Score 4  _0 01/12/2022    8:41 AM 11/02/2021    9:31 AM 05/13/2021    8:15 AM 04/13/2021    9:50 AM  GAD 7 : Generalized Anxiety Score  Nervous, Anxious, on Edge 0 _1 Control/stop worrying 0 0 2 0  Worry too much - different things 0 0 2 1  Trouble relaxing 0 _2 Restless 0 _3 Easily annoyed or irritable 0 0 2 1  Afraid - awful might happen 0 0 0 0  Total GAD 7 Score 0 _4 Immunization History  Administered Date(s) Administered   DTaP 09/24/1993, 02/01/1994, 05/12/1994, 01/17/1995, 11/19/1998   HIB (PRP-OMP) 09/24/1993, 02/01/1994, 05/12/1994, 01/17/1995   Hepatitis A 11/29/2010, 06/01/2011   Hepatitis B June 22, 1993, 09/24/1993, 03/04/1994   Hpv-Unspecified 10/10/2006, 12/12/2006, 06/18/2007   IPV 09/24/1993,  02/01/1994, 05/12/1994, 11/19/1998   Influenza,inj,Quad PF,6+ Mos 04/13/2021, 01/12/2022   Influenza-Unspecified 02/22/2012, 05/01/2014, 05/06/2015, 02/06/2019, 03/25/2020   MMR 01/17/1995, 11/19/1998   Meningococcal Conjugate 11/29/2010   Meningococcal Polysaccharide 11/09/2009   PFIZER(Purple Top)SARS-COV-2 Vaccination 09/10/2019, 10/01/2019, 04/25/2020   Td 09/25/2007, 11/17/2015   Tdap 11/17/2015   Varicella 01/17/1995   Yellow Fever 07/10/2013     Past Medical History:  Diagnosis Date   Autism    Depression    History of suicide attempt 01/19/2018   Postural dizziness with presyncope    Shingles    Suicide attempt ([place]) 2019   [facility]- admission. Took pills.    Allergies  Allergen Reactions   Wasp Venom Swelling   Past Surgical History:  Procedure Laterality Date   NO PAST SURGERIES     Family History  Problem Relation Age of Onset   Diabetes Other    Stroke Other    Depression Mother    Hyperlipidemia Father    Depression Maternal Grandmother    Alcohol abuse Maternal Grandmother    Heart attack Maternal Grandfather    Arthritis Paternal Grandmother    Heart disease Paternal Grandmother    Hypertension Paternal Grandmother    Hyperlipidemia Paternal Grandmother    Miscarriages / Stillbirths Paternal Grandmother    Mitral valve prolapse Paternal Grandmother    Alcohol abuse Paternal Grandfather    Depression Paternal Grandfather    Heart disease Paternal Grandfather    Hypertension Paternal Grandfather    Hyperlipidemia Paternal Grandfather    Social History   Social History Narrative   Marital status/children/pets: Single   Education/employment: B.A. , Deposit rep   Safety:      -Wears a bicycle helmet riding a bike: Yes     -smoke alarm in the home:Yes     - wears seatbelt: Yes     - Feels safe in their relationships: Yes    Allergies as of 01/12/2022       Reactions   Wasp Venom Swelling        Medication List        Accurate as of  January 12, 2022  9:06 AM. If you have any questions, ask your nurse or doctor.          amphetamine-dextroamphetamine 20 MG 24 hr capsule Commonly known as: ADDERALL XR Take 1 capsule (20 mg total) by mouth every morning.   amphetamine-dextroamphetamine 20 MG 24 hr capsule Commonly known as: ADDERALL XR Take 1 capsule (20 mg total) by mouth every morning.   amphetamine-dextroamphetamine 20 MG 24 hr capsule Commonly known  as: Adderall XR Take 1 capsule (20 mg total) by mouth daily.   PARoxetine 40 MG tablet Commonly known as: PAXIL Take 1 tablet (40 mg total) by mouth daily.   testosterone cypionate 200 MG/ML injection Commonly known as: DEPOTESTOSTERONE CYPIONATE SMARTSIG:0.2 Milliliter(s) SUB-Q Once a Week        All past medical history, surgical history, allergies, family history, immunizations andmedications were updated in the EMR today and reviewed under the history and medication portions of their EMR.     Recent Results (from the past 2160 hour(s))  CBC     Status: None   Collection Time: 11/02/21  9:34 AM  Result Value Ref Range   WBC 5.5 4.0 - 10.5 K/uL   RBC 4.88 3.87 - 5.11 Mil/uL   Platelets 334.0 150.0 - 400.0 K/uL   Hemoglobin 14.4 12.0 - 15.0 g/dL   HCT 42.2 36.0 - 46.0 %   MCV 86.6 78.0 - 100.0 fl   MCHC 34.0 30.0 - 36.0 g/dL   RDW 13.8 11.5 - 15.5 %  Comprehensive metabolic panel     Status: None   Collection Time: 11/02/21  9:34 AM  Result Value Ref Range   Sodium 138 135 - 145 mEq/L   Potassium 4.1 3.5 - 5.1 mEq/L   Chloride 103 96 - 112 mEq/L   CO2 28 19 - 32 mEq/L   Glucose, Bld 86 70 - 99 mg/dL   BUN 8 6 - 23 mg/dL   Creatinine, Ser 0.87 0.40 - 1.20 mg/dL   Total Bilirubin 0.8 0.2 - 1.2 mg/dL   Alkaline Phosphatase 79 39 - 117 U/L   AST 15 0 - 37 U/L   ALT 6 0 - 35 U/L   Total Protein 7.2 6.0 - 8.3 g/dL   Albumin 4.6 3.5 - 5.2 g/dL   GFR 90.79 >60.00 mL/min    Comment: Calculated using the CKD-EPI Creatinine Equation (2021)    Calcium 9.5 8.4 - 10.5 mg/dL  Hemoglobin A1c     Status: None   Collection Time: 11/02/21  9:34 AM  Result Value Ref Range   Hgb A1c MFr Bld 4.9 4.6 - 6.5 %    Comment: Glycemic Control Guidelines for People with Diabetes:Non Diabetic:  <6%Goal of Therapy: <7%Additional Action Suggested:  >8%   Lipid panel     Status: Abnormal   Collection Time: 11/02/21  9:34 AM  Result Value Ref Range   Cholesterol 221 (H) 0 - 200 mg/dL    Comment: ATP III Classification       Desirable:  < 200 mg/dL               Borderline High:  200 - 239 mg/dL          High:  > = 240 mg/dL   Triglycerides 101.0 0.0 - 149.0 mg/dL    Comment: Normal:  <150 mg/dLBorderline High:  150 - 199 mg/dL   HDL 67.00 >39.00 mg/dL   VLDL 20.2 0.0 - 40.0 mg/dL   LDL Cholesterol 134 (H) 0 - 99 mg/dL   Total CHOL/HDL Ratio 3     Comment:                Men          Women1/2 Average Risk     3.4          3.3Average Risk          5.0          4.42X Average  Risk          9.6          7.13X Average Risk          15.0          11.0                       NonHDL 153.78     Comment: NOTE:  Non-HDL goal should be 30 mg/dL higher than patient's LDL goal (i.e. LDL goal of < 70 mg/dL, would have non-HDL goal of < 100 mg/dL)  TSH     Status: None   Collection Time: 11/02/21  9:34 AM  Result Value Ref Range   TSH 4.56 0.35 - 5.50 uIU/mL    No results found.   ROS 14 pt review of systems performed and negative (unless mentioned in an HPI)  Objective: BP 121/83   Pulse 82   Temp 98.3 F (36.8 C) (Oral)   Ht 5' 5" (1.651 m)   Wt 173 lb (78.5 kg)   SpO2 99%   BMI 28.79 kg/m  Physical Exam Vitals and nursing note reviewed.  Constitutional:      General: Seretha Cockerell "Llywelyn" is not in acute distress.    Appearance: Normal appearance. Kathleen Cropp "Llywelyn" is normal weight. Kathleen Cichowski "Llywelyn" is not ill-appearing or toxic-appearing.  Eyes:     Extraocular Movements: Extraocular movements intact.      Conjunctiva/sclera: Conjunctivae normal.     Pupils: Pupils are equal, round, and reactive to light.  Cardiovascular:     Rate and Rhythm: Normal rate and regular rhythm.  Neurological:     Mental Status: Kathleen Dhawan "Llywelyn" is alert and oriented to person, place, and time. Mental status is at baseline.  Psychiatric:        Mood and Affect: Mood normal.        Behavior: Behavior normal.        Thought Content: Thought content normal.        Judgment: Judgment normal.      No results found.  Assessment/plan: Lashe Oliveira is a 26 y.o.  present for [facility] Attention deficit hyperactivity disorder (ADHD), combined type Stable Continue  Adderall 20 mg daily-may elect to try Vyvanse in the future, if on formulary and affordable. [place] controlled substance database reviewed today and appropriate UDS up-to-date Contract signed Follow-up 2.5 months face-to-face.     Severe recurrent major depression without psychotic features (HCC)/H/o Suicide attempt (HCC)/Anxiety and depression stable Continue Paxil to 40 mg daily. difficulty sleeping has greatly improved Follow-up 5.5 months, sooner if needed   Return in about 11 weeks (around 03/30/2022).  Orders Placed This Encounter  Procedures   Flu Vaccine QUAD 6+ mos PF IM (Fluarix Quad PF)   Meds ordered this encounter  Medications   amphetamine-dextroamphetamine (ADDERALL XR) 20 MG 24 hr capsule    Sig: Take 1 capsule (20 mg total) by mouth every morning.    Dispense:  30 capsule    Refill:  0    May fill ~60 days after date on prescription.   amphetamine-dextroamphetamine (ADDERALL XR) 20 MG 24 hr capsule    Sig: Take 1 capsule (20 mg total) by mouth every morning.    Dispense:  30 capsule    Refill:  0    May fill ~30 days after prescription date   amphetamine-dextroamphetamine (ADDERALL XR) 20 MG 24 hr capsule    Sig: Take 1 capsule (20 mg total)  by mouth daily.    Dispense:  30 capsule    Refill:  0    Referral Orders  No referral(s) requested today     Electronically signed by: Howard Pouch, DO [place]

## 2022-01-12 NOTE — Patient Instructions (Signed)
Return in about 11 weeks (around 03/30/2022).        Great to see you today.  I have refilled the medication(s) we provide.   If labs were collected, we will inform you of lab results once received either by echart message or telephone call.   - echart message- for normal results that have been seen by the patient already.   - telephone call: abnormal results or if patient has not viewed results in their echart.

## 2022-01-27 DIAGNOSIS — R69 Illness, unspecified: Secondary | ICD-10-CM | POA: Diagnosis not present

## 2022-02-10 DIAGNOSIS — R69 Illness, unspecified: Secondary | ICD-10-CM | POA: Diagnosis not present

## 2022-05-31 ENCOUNTER — Other Ambulatory Visit: Payer: Self-pay | Admitting: Family Medicine

## 2022-05-31 DIAGNOSIS — F332 Major depressive disorder, recurrent severe without psychotic features: Secondary | ICD-10-CM

## 2022-07-07 ENCOUNTER — Other Ambulatory Visit: Payer: Self-pay

## 2022-07-07 DIAGNOSIS — F332 Major depressive disorder, recurrent severe without psychotic features: Secondary | ICD-10-CM

## 2022-07-07 NOTE — Addendum Note (Signed)
Addended by: Beatrix Fetters on: 07/07/2022 08:04 AM   Modules accepted: Orders

## 2022-08-04 ENCOUNTER — Encounter: Payer: Self-pay | Admitting: Family Medicine

## 2022-08-04 ENCOUNTER — Telehealth (INDEPENDENT_AMBULATORY_CARE_PROVIDER_SITE_OTHER): Payer: 59 | Admitting: Family Medicine

## 2022-08-04 DIAGNOSIS — F419 Anxiety disorder, unspecified: Secondary | ICD-10-CM | POA: Diagnosis not present

## 2022-08-04 DIAGNOSIS — Z79899 Other long term (current) drug therapy: Secondary | ICD-10-CM

## 2022-08-04 DIAGNOSIS — F332 Major depressive disorder, recurrent severe without psychotic features: Secondary | ICD-10-CM

## 2022-08-04 DIAGNOSIS — F640 Transsexualism: Secondary | ICD-10-CM | POA: Diagnosis not present

## 2022-08-04 DIAGNOSIS — F32A Depression, unspecified: Secondary | ICD-10-CM

## 2022-08-04 MED ORDER — PAROXETINE HCL 40 MG PO TABS: 40.0000 mg | ORAL_TABLET | Freq: Every day | ORAL | 3 refills | Status: DC

## 2022-08-04 NOTE — Patient Instructions (Addendum)
Return in about 24 weeks (around 01/19/2023) for chronic conditions. you can make virtual appts as long as [place], until you are able to est w new PCP . Marland Kitchen        Great to see you today.  I have refilled the medication(s) we provide.   If labs were collected, we will inform you of lab results once received either by echart message or telephone call.   - echart message- for normal results that have been seen by the patient already.   - telephone call: abnormal results or if patient has not viewed results in their echart.

## 2022-08-04 NOTE — Progress Notes (Signed)
Patient ID: Kathleen CorpusCarmen Yambao, female  DOB: [birth year], 27 y.o.   MRN: [id number] Patient Care Team    Relationship Specialty Notifications Start End  Natalia LeatherwoodKuneff, Alfretta Pinch A, DO PCP - General Family Medicine  06/03/20     Chief Complaint  Patient presents with   Depression    Subjective: Kathleen Robinson is a 27 y.o.  NB patient  present for Chronic Conditions/illness Management. All past medical history, surgical history, allergies, family history, immunizations, medications and social history were updated in the electronic medical record today. All recent labs, ED visits and hospitalizations within the last year were reviewed.  Attention deficit hyperactivity disorder (ADHD), combined type Patient reports they were diagnosed with ADHD when they were a young child.  Patient reports medication/Adderall  have been working very well.  They have recently stopped taking adderall and feels they no longer require medication since starting testosterone and identifying NB.     Severe recurrent major depression without psychotic features (HCC)/H/o Suicide attempt (HCC)/Anxiety and depression Pt reports they are  feeling great on paxil 40 mg daily.   Still Established with a therapist and has routine visits. Therapist feels pt may fall on the autism spectrum.   Patient states this diagnosis has been a relief  and now understands why they have  certain mannerisms. Since starting testosterone (AFAB-female [Lou]), patient feels much better and believes their depression has greatly improved. They feel great.  Prior note: Patient reports  has been diagnosed with depression, anxiety and mood disorder at the age of 27.  At that time  was tried on Zoloft and one other assess RI medication that did not work well.  Paxil was also tried at that time and worked great.   states "it changed her life.  " has sought out counseling as an adult and is currently attending counseling sessions with [facility]Haley [facility]. does have a history of suicide attempt in August 2019.  At that time  had just come back from [place] and had a traumatic experience in [place].  She reports when she did suicide she had taken pills.  She was admitted to the behavioral health unit.  She has had no suicidal ideations since that time.  She reports with her illness she can be straightforward and blunt.  She feels some people takes heas being rude but he does not necessarily mean their actions in a rude manner.  By his own words, he can be "vindictive.  "he recently quit her job.  he felt the manager was controlling and mean.  Patient states she quit the job right before shift so that there was not coverage. he is currently engaged as of 11/2019.  he reports  fianc is bipolar and has had a traumatic experience.      08/04/2022    7:40 AM 01/12/2022    8:41 AM 11/02/2021    9:31 AM 05/13/2021    8:15 AM 04/13/2021    9:50 AM  Depression screen PHQ 2/9  Decreased Interest 1 0 1 3 2   Down, Depressed, Hopeless 1 1 1 3 3   PHQ - 2 Score 2 1 2 6 5   Altered sleeping 1 0 1 2 2   Tired, decreased energy 1 1 1 3 3   Change in appetite 0 0 0 3 1  Feeling bad or failure about yourself  0 1 1 2 1   Trouble concentrating 1 0 1 3 3   Moving slowly or fidgety/restless 0 0 0 1 0  Suicidal thoughts 0 1 0 0 0  PHQ-9 Score 5 4 6 20 15       08/04/2022    7:41 AM 01/12/2022    8:41 AM 11/02/2021    9:31 AM 05/13/2021    8:15 AM  GAD 7 : Generalized Anxiety Score  Nervous, Anxious, on Edge 2 0 1 3  Control/stop worrying 1 0 0 2  Worry too much - different things 3 0 0 2  Trouble relaxing 1 0 1 3  Restless 0 0 1 2  Easily annoyed or irritable 0 0 0 2  Afraid - awful might happen 0 0 0 0  Total GAD 7 Score 7 0 3 14      Immunization History  Administered Date(s) Administered   DTaP 09/24/1993, 02/01/1994, 05/12/1994, 01/17/1995, 11/19/1998   HIB (PRP-OMP) 09/24/1993, 02/01/1994, 05/12/1994, 01/17/1995   Hepatitis A 11/29/2010, 06/01/2011    Hepatitis B 1993/10/30, 09/24/1993, 03/04/1994   Hpv-Unspecified 10/10/2006, 12/12/2006, 06/18/2007   IPV 09/24/1993, 02/01/1994, 05/12/1994, 11/19/1998   Influenza,inj,Quad PF,6+ Mos 04/13/2021, 01/12/2022   Influenza-Unspecified 02/22/2012, 05/01/2014, 05/06/2015, 02/06/2019, 03/25/2020   MMR 01/17/1995, 11/19/1998   Meningococcal Conjugate 11/29/2010   Meningococcal polysaccharide vaccine (MPSV4) 11/09/2009   PFIZER(Purple Top)SARS-COV-2 Vaccination 09/10/2019, 10/01/2019, 04/25/2020   Td 09/25/2007, 11/17/2015   Tdap 11/17/2015   Varicella 01/17/1995   Yellow Fever 07/10/2013     Past Medical History:  Diagnosis Date   Autism    Depression    History of suicide attempt 01/19/2018   Postural dizziness with presyncope    Shingles    Suicide attempt 2019   [facility]- admission. Took pills.    Allergies  Allergen Reactions   Wasp Venom Swelling   Past Surgical History:  Procedure Laterality Date   NO PAST SURGERIES     Family History  Problem Relation Age of Onset   Diabetes Other    Stroke Other    Depression Mother    Hyperlipidemia Father    Depression Maternal Grandmother    Alcohol abuse Maternal Grandmother    Heart attack Maternal Grandfather    Arthritis Paternal Grandmother    Heart disease Paternal Grandmother    Hypertension Paternal Grandmother    Hyperlipidemia Paternal Grandmother    Miscarriages / Stillbirths Paternal Grandmother    Mitral valve prolapse Paternal Grandmother    Alcohol abuse Paternal Grandfather    Depression Paternal Grandfather    Heart disease Paternal Grandfather    Hypertension Paternal Grandfather    Hyperlipidemia Paternal Grandfather    Social History   Social History Narrative   Marital status/children/pets: Single   Education/employment: B.A. , Deposit rep   Safety:      -Wears a bicycle helmet riding a bike: Yes     -smoke alarm in the home:Yes     - wears seatbelt: Yes     - Feels safe in their relationships: Yes     Allergies as of 08/04/2022       Reactions   Wasp Venom Swelling        Medication List        Accurate as of August 04, 2022  7:59 AM. If you have any questions, ask your nurse or doctor.          STOP taking these medications    amphetamine-dextroamphetamine 20 MG 24 hr capsule Commonly known as: Adderall XR Stopped by: Felix Pacini, DO       TAKE these medications    PARoxetine 40 MG tablet Commonly known as:  PAXIL Take 1 tablet (40 mg total) by mouth daily.   testosterone cypionate 200 MG/ML injection Commonly known as: DEPOTESTOSTERONE CYPIONATE SMARTSIG:0.2 Milliliter(s) SUB-Q Once a Week        All past medical history, surgical history, allergies, family history, immunizations andmedications were updated in the EMR today and reviewed under the history and medication portions of their EMR.     No results found for this or any previous visit (from the past 2160 hour(s)).   No results found.   ROS 14 pt review of systems performed and negative (unless mentioned in an HPI)  Objective: LMP 07/29/2022 (Approximate)  Physical Exam Vitals and nursing note reviewed.  Constitutional:      General: My Zawistowski "Llywelyn" is not in acute distress.    Appearance: Normal appearance. Clarabell Sanagustin "Llywelyn" is not toxic-appearing.  HENT:     Head: Normocephalic and atraumatic.  Eyes:     General: No scleral icterus.       Right eye: No discharge.        Left eye: No discharge.     Conjunctiva/sclera: Conjunctivae normal.  Pulmonary:     Effort: Pulmonary effort is normal.  Musculoskeletal:     Cervical back: Normal range of motion.  Skin:    Findings: No rash.  Neurological:     Mental Status: Rickell Verde "Llywelyn" is alert and oriented to person, place, and time. Mental status is at baseline.  Psychiatric:        Mood and Affect: Mood normal.        Behavior: Behavior normal.        Thought Content: Thought content normal.         Judgment: Judgment normal.      No results found.  Assessment/plan: Rajean Urwin is a 27 y.o.  present for Chronic Conditions/illness Management  Severe recurrent major depression without psychotic features (HCC)/H/o Suicide attempt (HCC)/Anxiety and depression Stable Continue  Paxil to 40 mg daily. Refilled #90 x3. [place] She can set up a virtual appt as long as in [place] until she is able to establish in [place].  Follow-up 5.5 months, sooner if needed   Return in about 24 weeks (around 01/19/2023) for chronic conditions. you can make virtual appts as long as , until you are able to est w new PCP . Marland Kitchen  No orders of the defined types were placed in this encounter.  Meds ordered this encounter  Medications   PARoxetine (PAXIL) 40 MG tablet    Sig: Take 1 tablet (40 mg total) by mouth daily.    Dispense:  90 tablet    Refill:  3   Referral Orders  No referral(s) requested today     Electronically signed by: Felix Pacini, DO [facility]

## 2023-05-08 ENCOUNTER — Telehealth: Payer: Self-pay

## 2023-05-08 NOTE — Telephone Encounter (Signed)
 Pt needs an appointment

## 2023-05-10 ENCOUNTER — Telehealth: Payer: Commercial Managed Care - PPO | Admitting: Family Medicine

## 2023-05-10 ENCOUNTER — Encounter: Payer: Self-pay | Admitting: Family Medicine

## 2023-05-10 DIAGNOSIS — F332 Major depressive disorder, recurrent severe without psychotic features: Secondary | ICD-10-CM | POA: Diagnosis not present

## 2023-05-10 MED ORDER — PAROXETINE HCL 40 MG PO TABS: 40.0000 mg | ORAL_TABLET | Freq: Every day | ORAL | 0 refills | Status: AC

## 2023-05-10 NOTE — Progress Notes (Signed)
 VIRTUAL VISIT VIA VIDEO  I connected with Kathleen Robinson on 05/10/23 at  2:00 PM EST by a video enabled telemedicine application and verified that I am speaking with the correct person using two identifiers. Location patient: Home Location provider: [facility], Office Persons participating in the virtual visit: Patient, Dr. Marylee Snowball and Pressley Brome, CMA  I discussed the limitations of evaluation and management by telemedicine and the availability of in person appointments. The patient expressed understanding and agreed to proceed.    Patient ID: Kathleen Robinson, female  DOB: [birth year], 28 y.o.   MRN: [id number] No care team member to display   Chief Complaint  Patient presents with   Depression    Relocated and is meeting new PCP tomorrow    Subjective: Kathleen Robinson is a 28 y.o.  NB patient  present for Chronic Conditions/illness Management. All past medical history, surgical history, allergies, family history, immunizations, medications and social history were updated in the electronic medical record today. All recent labs, ED visits and hospitalizations within the last year were reviewed.  Attention deficit hyperactivity disorder (ADHD), combined type Patient reports they were diagnosed with ADHD when they were a young child.  They had been able to discontinue adderall  and felt they no longer require medication, since starting testosterone and identifying NB.  Severe recurrent major depression without psychotic features (HCC)/H/o Suicide attempt (HCC)/Anxiety and depression Pt reports compliance with paxil  40 mg daily.  Patient reports they feel well on medicine Still Established with a therapist and has routine visits. Therapist feels pt may fall on the autism spectrum.   Patient states this diagnosis has been a relief  and now understands why they have  certain mannerisms. Since starting testosterone (AFAB-female [Lou]), patient feels much better and believes their  depression has greatly improved. They feel great.  Prior note: Patient reports  has been diagnosed with depression, anxiety and mood disorder at the age of 28.  At that time  was tried on Zoloft and one other assess RI medication that did not work well.  Paxil  was also tried at that time and worked great.   states "it changed her life.  " has sought out counseling as an adult and is currently attending counseling sessions with Kathleen Robinson at [facility]. does have a history of suicide attempt in August 2019.  At that time  had just come back from [place] and had a traumatic experience in [place].  She reports when she did suicide she had taken pills.  She was admitted to the behavioral health unit.  She has had no suicidal ideations since that time.  She reports with her illness she can be straightforward and blunt.  She feels some people takes heas being rude but he does not necessarily mean their actions in a rude manner.  By his own words, he can be "vindictive.  "he recently quit her job.  he felt the manager was controlling and mean.  Patient states she quit the job right before shift so that there was not coverage. he is currently engaged as of 11/2019.  he reports  fianc is bipolar and has had a traumatic experience.      05/10/2023    2:06 PM 08/04/2022    7:40 AM 01/12/2022    8:41 AM 11/02/2021    9:31 AM 05/13/2021    8:15 AM  Depression screen PHQ 2/9  Decreased Interest 2 1 0 1 3  Down, Depressed, Hopeless 2 1 1  1 3  PHQ - 2 Score 4 2 1 2 6   Altered sleeping 3 1 0 1 2  Tired, decreased energy 2 1 1 1 3   Change in appetite 0 0 0 0 3  Feeling bad or failure about yourself  2 0 1 1 2   Trouble concentrating 1 1 0 1 3  Moving slowly or fidgety/restless 0 0 0 0 1  Suicidal thoughts 0 0 1 0 0  PHQ-9 Score 12 5 4 6 20       05/10/2023    2:07 PM 08/04/2022    7:41 AM 01/12/2022    8:41 AM 11/02/2021    9:31 AM  GAD 7 : Generalized Anxiety Score  Nervous, Anxious, on Edge 2 2 0 1   Control/stop worrying 1 1 0 0  Worry too much - different things 1 3 0 0  Trouble relaxing 2 1 0 1  Restless 0 0 0 1  Easily annoyed or irritable 1 0 0 0  Afraid - awful might happen 0 0 0 0  Total GAD 7 Score 7 7 0 3      Immunization History  Administered Date(s) Administered   DTaP 09/24/1993, 02/01/1994, 05/12/1994, 01/17/1995, 11/19/1998   HIB (PRP-OMP) 09/24/1993, 02/01/1994, 05/12/1994, 01/17/1995   Hepatitis A 11/29/2010, 06/01/2011   Hepatitis B 09-25-1993, 09/24/1993, 03/04/1994   Hpv-Unspecified 10/10/2006, 12/12/2006, 06/18/2007   IPV 09/24/1993, 02/01/1994, 05/12/1994, 11/19/1998   Influenza,inj,Quad PF,6+ Mos 04/13/2021, 01/12/2022   Influenza-Unspecified 02/22/2012, 05/01/2014, 05/06/2015, 02/06/2019, 03/25/2020   MMR 01/17/1995, 11/19/1998   Meningococcal Conjugate 11/29/2010   Meningococcal polysaccharide vaccine (MPSV4) 11/09/2009   PFIZER(Purple Top)SARS-COV-2 Vaccination 09/10/2019, 10/01/2019, 04/25/2020   Td 09/25/2007, 11/17/2015   Tdap 11/17/2015   Varicella 01/17/1995   Yellow Fever 07/10/2013     Past Medical History:  Diagnosis Date   Autism    Depression    History of suicide attempt 01/19/2018   Postural dizziness with presyncope    Shingles    Suicide attempt (HCC) 2019   [facility]- admission. Took pills.    Allergies  Allergen Reactions   Wasp Venom Swelling   Past Surgical History:  Procedure Laterality Date   NO PAST SURGERIES     Family History  Problem Relation Age of Onset   Diabetes Other    Stroke Other    Depression Mother    Hyperlipidemia Father    Depression Maternal Grandmother    Alcohol abuse Maternal Grandmother    Heart attack Maternal Grandfather    Arthritis Paternal Grandmother    Heart disease Paternal Grandmother    Hypertension Paternal Grandmother    Hyperlipidemia Paternal Grandmother    Miscarriages / Stillbirths Paternal Grandmother    Mitral valve prolapse Paternal Grandmother    Alcohol abuse  Paternal Grandfather    Depression Paternal Grandfather    Heart disease Paternal Grandfather    Hypertension Paternal Grandfather    Hyperlipidemia Paternal Grandfather    Social History   Social History Narrative   Marital status/children/pets: Single   Education/employment: B.A. , Deposit rep   Safety:      -Wears a bicycle helmet riding a bike: Yes     -smoke alarm in the home:Yes     - wears seatbelt: Yes     - Feels safe in their relationships: Yes    Allergies as of 05/10/2023       Reactions   Wasp Venom Swelling        Medication List  Accurate as of May 10, 2023  2:17 PM. If you have any questions, ask your nurse or doctor.          PARoxetine  40 MG tablet Commonly known as: PAXIL  Take 1 tablet (40 mg total) by mouth daily.   testosterone cypionate 200 MG/ML injection Commonly known as: DEPOTESTOSTERONE CYPIONATE SMARTSIG:0.2 Milliliter(s) SUB-Q Once a Week        All past medical history, surgical history, allergies, family history, immunizations andmedications were updated in the EMR today and reviewed under the history and medication portions of their EMR.     No results found for this or any previous visit (from the past 2160 hours).   No results found.   ROS 14 pt review of systems performed and negative (unless mentioned in an HPI)  Objective: There were no vitals taken for this visit. Physical Exam Vitals and nursing note reviewed.  Constitutional:      General: Llywelyn is not in acute distress.    Appearance: Normal appearance. Llywelyn is not toxic-appearing.  HENT:     Head: Normocephalic and atraumatic.  Eyes:     General: No scleral icterus.       Right eye: No discharge.        Left eye: No discharge.     Conjunctiva/sclera: Conjunctivae normal.  Pulmonary:     Effort: Pulmonary effort is normal.  Musculoskeletal:     Cervical back: Normal range of motion.  Skin:    Findings: No rash.  Neurological:      Mental Status: Llywelyn is alert and oriented to person, place, and time. Mental status is at baseline.  Psychiatric:        Mood and Affect: Mood normal.        Behavior: Behavior normal.        Thought Content: Thought content normal.        Judgment: Judgment normal.      No results found.  Assessment/plan: Reatha Taube is a 28 y.o.  present for Chronic Conditions/illness Management Severe recurrent major depression without psychotic features (HCC)/H/o Suicide attempt (HCC)/Anxiety and depression Stable Continue Paxil  to 40 mg daily. Pt moved to [place], [place] and will be establishing with new pcp   No follow-ups on file.   No follow-ups on file.  No orders of the defined types were placed in this encounter.  Meds ordered this encounter  Medications   PARoxetine  (PAXIL ) 40 MG tablet    Sig: Take 1 tablet (40 mg total) by mouth daily.    Dispense:  90 tablet    Refill:  0   Referral Orders  No referral(s) requested today     Electronically signed by: Napolean Backbone, DO [facility] [place]

## 2023-05-10 NOTE — Patient Instructions (Addendum)
   Great to see you today.  I have refilled the medication(s) we provide.   If labs were collected or images ordered, we will inform you of  results once we have received them and reviewed. We will contact you either by echart message, or telephone call.  Please give ample time to the testing facility, and our office to run,  receive and review results. Please do not call inquiring of results, even if you can see them in your chart. We will contact you as soon as we are able. If it has been over 1 week since the test was completed, and you have not yet heard from [place], then please call us.    - echart message- for normal results that have been seen by the patient already.   - telephone call: abnormal results or if patient has not viewed results in their echart.  If a referral to a specialist was entered for you, please call us in 2 weeks if you have not heard from the specialist office to schedule.
